# Patient Record
Sex: Female | Born: 1980 | Race: Black or African American | Hispanic: No | Marital: Single | Smoking: Current every day smoker
Health system: Southern US, Community
[De-identification: ages and names within clinical notes are randomized; demographics above are authoritative.]

## PROBLEM LIST (undated history)

## (undated) DIAGNOSIS — E079 Disorder of thyroid, unspecified: Secondary | ICD-10-CM

## (undated) DIAGNOSIS — E119 Type 2 diabetes mellitus without complications: Secondary | ICD-10-CM

## (undated) DIAGNOSIS — K219 Gastro-esophageal reflux disease without esophagitis: Secondary | ICD-10-CM

## (undated) DIAGNOSIS — K589 Irritable bowel syndrome without diarrhea: Secondary | ICD-10-CM

## (undated) DIAGNOSIS — M797 Fibromyalgia: Secondary | ICD-10-CM

---

## 2020-11-07 ENCOUNTER — Ambulatory Visit: Admit: 2020-11-07 | Payer: Self-pay

## 2020-11-07 ENCOUNTER — Ambulatory Visit
Admission: EM | Admit: 2020-11-07 | Discharge: 2020-11-07 | Disposition: A | Discharge status: Home / Self Care | Payer: Self-pay | Attending: Emergency Medicine | Admitting: Emergency Medicine

## 2020-11-07 ENCOUNTER — Telehealth: Payer: Self-pay

## 2020-11-07 ENCOUNTER — Other Ambulatory Visit: Payer: Self-pay

## 2020-11-07 DIAGNOSIS — N760 Acute vaginitis: Secondary | ICD-10-CM | POA: Insufficient documentation

## 2020-11-07 DIAGNOSIS — B9689 Other specified bacterial agents as the cause of diseases classified elsewhere: Secondary | ICD-10-CM | POA: Insufficient documentation

## 2020-11-07 HISTORY — DX: Fibromyalgia: M79.7

## 2020-11-07 HISTORY — DX: Type 2 diabetes mellitus without complications: E11.9

## 2020-11-07 HISTORY — DX: Disorder of thyroid, unspecified: E07.9

## 2020-11-07 HISTORY — DX: Gastro-esophageal reflux disease without esophagitis: K21.9

## 2020-11-07 HISTORY — DX: Irritable bowel syndrome, unspecified: K58.9

## 2020-11-07 LAB — CHLAMYDIA/NGC RT PCR (ARMC ONLY)
Chlamydia Tr: NOT DETECTED
N gonorrhoeae: NOT DETECTED

## 2020-11-07 LAB — WET PREP, GENITAL
Sperm: NONE SEEN
Trich, Wet Prep: NONE SEEN
Yeast Wet Prep HPF POC: NONE SEEN

## 2020-11-07 MED ORDER — FLUCONAZOLE 150 MG PO TABS: 150.0000 mg | ORAL_TABLET | ORAL | 0 refills | Status: AC | PRN

## 2020-11-07 MED ORDER — METRONIDAZOLE 500 MG PO TABS: 500.0000 mg | ORAL_TABLET | Freq: Two times a day (BID) | ORAL | 0 refills | Status: DC

## 2020-11-07 MED ORDER — FLUCONAZOLE 150 MG PO TABS: 150.0000 mg | ORAL_TABLET | ORAL | 0 refills | Status: DC | PRN

## 2020-11-07 MED ORDER — NYSTATIN 100000 UNIT/GM EX CREA: TOPICAL_CREAM | CUTANEOUS | 0 refills | Status: AC

## 2020-11-07 MED ORDER — NYSTATIN 100000 UNIT/GM EX CREA: TOPICAL_CREAM | CUTANEOUS | 0 refills | Status: DC

## 2020-11-07 MED ORDER — METRONIDAZOLE 500 MG PO TABS: 500.0000 mg | ORAL_TABLET | Freq: Two times a day (BID) | ORAL | 0 refills | Status: AC

## 2020-11-07 NOTE — ED Provider Notes (Signed)
[facility]    CSN: [id number] Arrival date & time: 11/07/20  0954      History   Chief Complaint Chief Complaint  Patient presents with  . Vaginal Itching    HPI Angela Landry is a 40 y.o. female presenting for vaginal itching and burning for the past couple of days.  She denies any discharge.  Patient afraid she may have a vaginal yeast infection.  She did use over-the-counter yeast infection medication so symptoms improved a little bit but she still having intense itching.  Patient also mitts to history of bacterial vaginosis.  She denies any concern for STIs but does request STI testing today.  Patient denying any pelvic or abdominal pain, dysuria, urinary frequency urgency or back pain.  Patient does have history of diabetes and says her blood sugars have not been under very good control lately.  She takes Ozempic but no other medications.  No other concerns.  HPI  Past Medical History:  Diagnosis Date  . Diabetes mellitus without complication (HCC)   . Fibromyalgia   . GERD (gastroesophageal reflux disease)   . IBS (irritable bowel syndrome)   . Thyroid disease     There are no problems to display for this patient.   History reviewed. No pertinent surgical history.  OB History   No obstetric history on file.      Home Medications    Prior to Admission medications   Medication Sig Start Date End Date Taking? Authorizing Provider  ACCU-CHEK GUIDE test strip USE 1-4 TIMES DAILY AS DIRECTED BY PHYSICIAN TO MONITOR BLOOD SUGARS 10/10/20  Yes [provider]  ARMOUR THYROID 120 MG tablet Take 120 mg by mouth every morning. 10/10/20  Yes [provider]  ASPIRIN LOW DOSE 81 MG EC tablet Take 81 mg by mouth daily. 10/10/20  Yes [provider]  cyclobenzaprine (FLEXERIL) 10 MG tablet Take 1 tablet by mouth at bedtime as needed. 10/11/20  Yes [provider]  DULoxetine (CYMBALTA) 60 MG capsule Take 60 mg by mouth daily. 10/10/20   Yes [provider]  fluconazole (DIFLUCAN) 150 MG tablet Take 1 tablet (150 mg total) by mouth every 3 (three) days as needed for up to 7 days (vaginal infection). 11/07/20 11/14/20 Yes Eusebio Friendly B, PA-C  gabapentin (NEURONTIN) 300 MG capsule Take 3 capsules by mouth daily. 08/08/20  Yes [provider]  levonorgestrel (MIRENA) 20 MCG/DAY IUD by Intrauterine route.   Yes [provider]  linaclotide Karlene Einstein) 290 MCG CAPS capsule  12/08/15  Yes [provider]  meloxicam (MOBIC) 15 MG tablet Take 1 tablet by mouth daily as needed. 09/14/20  Yes [provider]  metroNIDAZOLE (FLAGYL) 500 MG tablet Take 1 tablet (500 mg total) by mouth 2 (two) times daily for 7 days. 11/07/20 11/14/20 Yes Eusebio Friendly B, PA-C  nystatin cream (MYCOSTATIN) Apply to affected area 2 times daily 11/07/20 11/14/20 Yes Eusebio Friendly B, PA-C  OZEMPIC, 0.25 OR 0.5 MG/DOSE, 2 MG/1.5ML SOPN Inject 0.5 mg into the skin once a week. 10/10/20  Yes [provider]  pantoprazole (PROTONIX) 40 MG tablet Take 1 tablet by mouth daily. 10/10/20  Yes [provider]  promethazine (PHENERGAN) 25 MG tablet Take 25 mg by mouth every 6 (six) hours as needed. 09/30/20  Yes [provider]    Family History No family history on file.  Social History Social History   Tobacco Use  . Smoking status: Never Smoker  . Smokeless tobacco:  Never Used  Substance Use Topics  . Alcohol use: Yes  . Drug use: Never     Allergies   Patient has no known allergies.   Review of Systems Review of Systems  Constitutional: Negative for fatigue and fever.  Gastrointestinal: Negative for abdominal pain, nausea and vomiting.  Genitourinary: Positive for vaginal discharge. Negative for dysuria, flank pain, frequency, hematuria, urgency and vaginal pain.       Vaginal itching  Musculoskeletal: Negative for back pain.  Skin: Negative for rash.     Physical Exam Triage Vital Signs ED  Triage Vitals  Enc Vitals Group     BP 11/07/20 1026 (!) 169/101     Pulse Rate 11/07/20 1026 (!) 101     Resp 11/07/20 1026 18     Temp 11/07/20 1026 98.4 F (36.9 C)     Temp Source 11/07/20 1026 Oral     SpO2 11/07/20 1026 99 %     Weight 11/07/20 1025 238 lb (108 kg)     Height --      Head Circumference --      Peak Flow --      Pain Score 11/07/20 1025 7     Pain Loc --      Pain Edu? --      Excl. in GC? --    No data found.  Updated Vital Signs BP (!) 169/101 (BP Location: Left Arm)   Pulse (!) 101   Temp 98.4 F (36.9 C) (Oral)   Resp 18   Wt 238 lb (108 kg)   LMP 11/01/2020   SpO2 99%       Physical Exam Vitals and nursing note reviewed.  Constitutional:      General: She is not in acute distress.    Appearance: Normal appearance. She is not ill-appearing or toxic-appearing.  HENT:     Head: Normocephalic and atraumatic.  Eyes:     General: No scleral icterus.       Right eye: No discharge.        Left eye: No discharge.     Conjunctiva/sclera: Conjunctivae normal.  Cardiovascular:     Rate and Rhythm: Normal rate and regular rhythm.     Heart sounds: Normal heart sounds.  Pulmonary:     Effort: Pulmonary effort is normal. No respiratory distress.     Breath sounds: Normal breath sounds.  Abdominal:     Palpations: Abdomen is soft.     Tenderness: There is no abdominal tenderness.  Musculoskeletal:     Cervical back: Neck supple.  Skin:    General: Skin is dry.  Neurological:     General: No focal deficit present.     Mental Status: She is alert. Mental status is at baseline.     Motor: No weakness.     Gait: Gait normal.  Psychiatric:        Mood and Affect: Mood normal.        Behavior: Behavior normal.        Thought Content: Thought content normal.      UC Treatments / Results  Labs (all labs ordered are listed, but only abnormal results are displayed) Labs Reviewed  WET PREP, GENITAL - Abnormal; Notable for the following  components:      Result Value   Clue Cells Wet Prep HPF POC PRESENT (*)    WBC, Wet Prep HPF POC FEW (*)    All other components within normal limits  CHLAMYDIA/NGC RT PCR (  [facility] ONLY)    EKG   Radiology No results found.  Procedures Procedures (including critical care time)  Medications Ordered in UC Medications - No data to display  Initial Impression / Assessment and Plan / UC Course  I have reviewed the triage vital signs and the nursing notes.  Pertinent labs & imaging results that were available during my care of the patient were reviewed by me and considered in my medical decision making (see chart for details).   40 year old female presenting for vaginal itching.  Patient elects to self swab today.  Wet prep is positive for clue cells.  Treating BV infection with metronidazole.  Since she also has intense vaginal itching did have a little bit of improvement with Monistat, sending in some Diflucan and clotrimazole for her in case she does have a vaginal yeast infection along with the BV infection.  Advised her to keep a close eye on her blood sugars and follow-up with PCP if they are not starting to get under control once her infection resolves.  STI testing performed.  Advised patient on how to access results.  Will await results before treating.   Final Clinical Impressions(s) / UC Diagnoses   Final diagnoses:  Bacterial vaginosis  Vaginitis and vulvovaginitis     Discharge Instructions     You have bacterial vaginosis.  I have sent in the metronidazole for this.  Do not drink alcohol when you take it.  Also sent in medication for yeast infection.  Your STI test will be back within 24 hours.  Results will be on MyChart but we will call if symptoms positive.  Given 9 your blood sugars and follow-up with your PCP if they are not appearing to be controlled with the Ozempic.    ED Prescriptions    Medication Sig Dispense Auth. Provider   metroNIDAZOLE  (FLAGYL) 500 MG tablet Take 1 tablet (500 mg total) by mouth 2 (two) times daily for 7 days. 14 tablet Eusebio Friendly B, PA-C   nystatin cream (MYCOSTATIN) Apply to affected area 2 times daily 30 g Michiel Cowboy, Srishti Strnad B, PA-C   fluconazole (DIFLUCAN) 150 MG tablet Take 1 tablet (150 mg total) by mouth every 3 (three) days as needed for up to 7 days (vaginal infection). 2 tablet Gareth Morgan     PDMP not reviewed this encounter.   Shirlee Latch, PA-C 11/07/20 1100

## 2020-11-07 NOTE — Discharge Instructions (Signed)
You have bacterial vaginosis.  I have sent in the metronidazole for this.  Do not drink alcohol when you take it.  Also sent in medication for yeast infection.  Your STI test will be back within 24 hours.  Results will be on MyChart but we will call if symptoms positive.  Given 9 your blood sugars and follow-up with your PCP if they are not appearing to be controlled with the Ozempic.

## 2020-11-07 NOTE — ED Triage Notes (Signed)
Pt c/o vaginal itching and burning. States she gets a yeast infection or bv often. Started Saturday

## 2021-10-15 ENCOUNTER — Ambulatory Visit
Admission: EM | Admit: 2021-10-15 | Discharge: 2021-10-15 | Disposition: A | Discharge status: Home / Self Care | Payer: BLUE CROSS/BLUE SHIELD | Attending: Emergency Medicine | Admitting: Emergency Medicine

## 2021-10-15 ENCOUNTER — Ambulatory Visit (INDEPENDENT_AMBULATORY_CARE_PROVIDER_SITE_OTHER): Payer: BLUE CROSS/BLUE SHIELD

## 2021-10-15 ENCOUNTER — Other Ambulatory Visit: Payer: Self-pay

## 2021-10-15 DIAGNOSIS — M25521 Pain in right elbow: Secondary | ICD-10-CM

## 2021-10-15 MED ORDER — METHYLPREDNISOLONE SODIUM SUCC 40 MG IJ SOLR
60.0000 mg | Freq: Once | INTRAMUSCULAR | Status: AC
  Administered 2021-10-15: 60 mg via INTRAMUSCULAR

## 2021-10-15 MED ORDER — NAPROXEN SODIUM 550 MG PO TABS: 550.0000 mg | ORAL_TABLET | Freq: Two times a day (BID) | ORAL | 0 refills | Status: DC

## 2021-10-15 MED ORDER — CYCLOBENZAPRINE HCL 10 MG PO TABS: 10.0000 mg | ORAL_TABLET | Freq: Every day | ORAL | 0 refills | Status: DC

## 2021-10-15 NOTE — ED Triage Notes (Signed)
Patient c.o Right elbow pain - "cant bend it, it hurts." This has been going on for 3 months. Patient denies any injury to that arm.  "I have tried everything, I even has it in a sling but it hurts hurts." ?

## 2021-10-15 NOTE — ED Provider Notes (Signed)
?[facility] ? ? ? ?CSN: [id number] ?Arrival date & time: 10/15/21  2924 ? ? ?  ? ?History   ?Chief Complaint ?No chief complaint on file. ? ? ?HPI ?Angela Landry is a 41 y.o. female.  ? ?Presents with constant right elbow pain radiating to the right shoulder for 3 months.  Pain is rated a 10 out of 10.  Associated numbness and tingling extending from elbow into the right hand.  Range of motion intact but elicits pain.  Has attempted use of over-the-counter ibuprofen which has been ineffective.  Denies precipitating event, injury or trauma prior to symptoms beginning. ?Past Medical History:  ?Diagnosis Date  ? Diabetes mellitus without complication (HCC)   ? Fibromyalgia   ? GERD (gastroesophageal reflux disease)   ? IBS (irritable bowel syndrome)   ? Thyroid disease   ? ? ?There are no problems to display for this patient. ? ? ?No past surgical history on file. ? ?OB History   ?No obstetric history on file. ?  ? ? ? ?Home Medications   ? ?Prior to Admission medications   ?Medication Sig Start Date End Date Taking? Authorizing Provider  ?ACCU-CHEK GUIDE test strip USE 1-4 TIMES DAILY AS DIRECTED BY PHYSICIAN TO MONITOR BLOOD SUGARS 10/10/20   [provider]  ?ARMOUR THYROID 120 MG tablet Take 120 mg by mouth every morning. 10/10/20   [provider]  ?ASPIRIN LOW DOSE 81 MG EC tablet Take 81 mg by mouth daily. 10/10/20   [provider]  ?cyclobenzaprine (FLEXERIL) 10 MG tablet Take 1 tablet by mouth at bedtime as needed. 10/11/20   [provider]  ?DULoxetine (CYMBALTA) 60 MG capsule Take 60 mg by mouth daily. 10/10/20   [provider]  ?gabapentin (NEURONTIN) 300 MG capsule Take 3 capsules by mouth daily. 08/08/20   [provider]  ?levonorgestrel (MIRENA) 20 MCG/DAY IUD by Intrauterine route.    [provider]  ?linaclotide Karlene Einstein) 290 MCG CAPS capsule  12/08/15   [provider]  ?meloxicam (MOBIC) 15 MG tablet Take 1 tablet by mouth  daily as needed. 09/14/20   [provider]  ?OZEMPIC, 0.25 OR 0.5 MG/DOSE, 2 MG/1.5ML SOPN Inject 0.5 mg into the skin once a week. 10/10/20   [provider]  ?pantoprazole (PROTONIX) 40 MG tablet Take 1 tablet by mouth daily. 10/10/20   [provider]  ?promethazine (PHENERGAN) 25 MG tablet Take 25 mg by mouth every 6 (six) hours as needed. 09/30/20   [provider]  ? ? ?Family History ?No family history on file. ? ?Social History ?Social History  ? ?Tobacco Use  ? Smoking status: Never  ? Smokeless tobacco: Never  ?Substance Use Topics  ? Alcohol use: Yes  ? Drug use: Never  ? ? ? ?Allergies   ?Patient has no known allergies. ? ? ?Review of Systems ?Review of Systems ? ? ?Physical Exam ?Triage Vital Signs ?ED Triage Vitals  ?Enc Vitals Group  ?   BP 10/15/21 0930 124/88  ?   Pulse Rate 10/15/21 0930 88  ?   Resp 10/15/21 0930 20  ?   Temp 10/15/21 0930 98.8 ?F (37.1 ?C)  ?   Temp Source 10/15/21 0930 Oral  ?   SpO2 10/15/21 0930 100 %  ?   Weight 10/15/21 0928 215 lb (97.5 kg)  ?   Height 10/15/21 0928 5\' 4"  (1.626 m)  ?   Head Circumference --   ?   Peak Flow --   ?  Pain Score 10/15/21 0927 10  ?   Pain Loc --   ?   Pain Edu? --   ?   Excl. in GC? --   ? ?No data found. ? ?Updated Vital Signs ?BP 124/88 (BP Location: Left Arm)   Pulse 88   Temp 98.8 ?F (37.1 ?C) (Oral)   Resp 20   Ht 5\' 4"  (1.626 m)   Wt 215 lb (97.5 kg)   SpO2 100%   BMI 36.90 kg/m?  ? ?Visual Acuity ?Right Eye Distance:   ?Left Eye Distance:   ?Bilateral Distance:   ? ?Right Eye Near:   ?Left Eye Near:    ?Bilateral Near:    ? ?Physical Exam ? ? ?UC Treatments / Results  ?Labs ?(all labs ordered are listed, but only abnormal results are displayed) ?Labs Reviewed - No data to display ? ?EKG ? ? ?Radiology ?No results found. ? ?Procedures ?Procedures (including critical care time) ? ?Medications Ordered in UC ?Medications - No data to display ? ?Initial Impression / Assessment and Plan / UC Course  ?I  have reviewed the triage vital signs and the nursing notes. ? ?Pertinent labs & imaging results that were available during my care of the patient were reviewed by me and considered in my medical decision making (see chart for details). ? ?Right elbow pain ? ?X-ray negative, discussed findings with patient, methylprednisolone injection given in office, discussed effects on blood sugar, advised to monitor closely, naproxen and Flexeril prescribed for outpatient management, recommended continued RICE, heat, arm sling and activity as tolerated, given walking referral to orthopedics if symptoms continue to persist ?Final Clinical Impressions(s) / UC Diagnoses  ? ?Final diagnoses:  ?None  ? ?Discharge Instructions   ?None ?  ? ?ED Prescriptions   ?None ?  ? ?PDMP not reviewed this encounter. ?  ? , NP ?10/15/21 1028 ? ?

## 2021-10-15 NOTE — Discharge Instructions (Addendum)
Your x-ray today did not show injury to the bone of your elbow. Your pain is most likely being caused by irritation to the soft tissues, this should improve as time progresses.  ? ?During tomorrow take naproxen twice daily for 5 days then as needed ? ?You may use muscle relaxer at bedtime for additional comfort, be mindful this medicine may make you drowsy ? ?You may apply heat or ice, whichever makes you feel better, to affected area in 15 minute intervals ? ?You may continue activity as tolerated, there is no injury therefore, it is important that you continue to move around so you do not loose strength to the area ? ? you may wrap arm  with ace wrap or continue use of sling. for additional support while completing activities, once wrapped if you begin to experience numbness or tingling it is too tight, remove and redo, you should be able to easily fit one finger under wrap  ? ?If symptoms persist ,you may follow up at urgent care or with orthopedic specialist for evaluation, an orthopedic doctor specializes in the bone, they may provide  management such as but not limited to imaging, long term medications and physical therapy  ?

## 2022-08-25 ENCOUNTER — Emergency Department: Payer: BLUE CROSS/BLUE SHIELD

## 2022-08-25 ENCOUNTER — Emergency Department
Admission: EM | Admit: 2022-08-25 | Discharge: 2022-08-25 | Disposition: A | Discharge status: Home / Self Care | Payer: BLUE CROSS/BLUE SHIELD | Attending: Emergency Medicine | Admitting: Emergency Medicine

## 2022-08-25 ENCOUNTER — Emergency Department (HOSPITAL_COMMUNITY): Payer: BLUE CROSS/BLUE SHIELD

## 2022-08-25 ENCOUNTER — Other Ambulatory Visit: Payer: Self-pay

## 2022-08-25 ENCOUNTER — Emergency Department
Admission: EM | Admit: 2022-08-25 | Discharge: 2022-08-26 | Disposition: A | Discharge status: Home / Self Care | Payer: BLUE CROSS/BLUE SHIELD | Source: Home / Self Care | Attending: Student in an Organized Health Care Education/Training Program | Admitting: Student in an Organized Health Care Education/Training Program

## 2022-08-25 DIAGNOSIS — F10129 Alcohol abuse with intoxication, unspecified: Secondary | ICD-10-CM | POA: Insufficient documentation

## 2022-08-25 DIAGNOSIS — M79604 Pain in right leg: Secondary | ICD-10-CM | POA: Insufficient documentation

## 2022-08-25 DIAGNOSIS — F10220 Alcohol dependence with intoxication, uncomplicated: Secondary | ICD-10-CM | POA: Diagnosis not present

## 2022-08-25 DIAGNOSIS — Y908 Blood alcohol level of 240 mg/100 ml or more: Secondary | ICD-10-CM | POA: Insufficient documentation

## 2022-08-25 DIAGNOSIS — F10929 Alcohol use, unspecified with intoxication, unspecified: Secondary | ICD-10-CM

## 2022-08-25 DIAGNOSIS — Y909 Presence of alcohol in blood, level not specified: Secondary | ICD-10-CM | POA: Insufficient documentation

## 2022-08-25 DIAGNOSIS — E119 Type 2 diabetes mellitus without complications: Secondary | ICD-10-CM | POA: Diagnosis not present

## 2022-08-25 DIAGNOSIS — M7121 Synovial cyst of popliteal space [Baker], right knee: Secondary | ICD-10-CM

## 2022-08-25 DIAGNOSIS — R252 Cramp and spasm: Secondary | ICD-10-CM

## 2022-08-25 DIAGNOSIS — F10920 Alcohol use, unspecified with intoxication, uncomplicated: Secondary | ICD-10-CM

## 2022-08-25 LAB — CBC WITH DIFFERENTIAL/PLATELET
Abs Immature Granulocytes: 0.02 10*3/uL (ref 0.00–0.07)
Basophils Absolute: 0.1 10*3/uL (ref 0.0–0.1)
Basophils Relative: 1 %
Eosinophils Absolute: 0 10*3/uL (ref 0.0–0.5)
Eosinophils Relative: 0 %
HCT: 40.1 % (ref 36.0–46.0)
Hemoglobin: 13.8 g/dL (ref 12.0–15.0)
Immature Granulocytes: 0 %
Lymphocytes Relative: 29 %
Lymphs Abs: 2 10*3/uL (ref 0.7–4.0)
MCH: 33.3 pg (ref 26.0–34.0)
MCHC: 34.4 g/dL (ref 30.0–36.0)
MCV: 96.9 fL (ref 80.0–100.0)
Monocytes Absolute: 0.3 10*3/uL (ref 0.1–1.0)
Monocytes Relative: 5 %
Neutro Abs: 4.7 10*3/uL (ref 1.7–7.7)
Neutrophils Relative %: 65 %
Platelets: 322 10*3/uL (ref 150–400)
RBC: 4.14 MIL/uL (ref 3.87–5.11)
RDW: 15.6 % — ABNORMAL HIGH (ref 11.5–15.5)
WBC: 7.2 10*3/uL (ref 4.0–10.5)
nRBC: 0 % (ref 0.0–0.2)

## 2022-08-25 LAB — COMPREHENSIVE METABOLIC PANEL
ALT: 25 U/L (ref 0–44)
AST: 40 U/L (ref 15–41)
Albumin: 4.1 g/dL (ref 3.5–5.0)
Alkaline Phosphatase: 95 U/L (ref 38–126)
Anion gap: 11 (ref 5–15)
BUN: 5 mg/dL — ABNORMAL LOW (ref 6–20)
CO2: 24 mmol/L (ref 22–32)
Calcium: 8.2 mg/dL — ABNORMAL LOW (ref 8.9–10.3)
Chloride: 101 mmol/L (ref 98–111)
Creatinine, Ser: 0.64 mg/dL (ref 0.44–1.00)
GFR, Estimated: >60 mL/min (ref >60)
Glucose, Bld: 247 mg/dL — ABNORMAL HIGH (ref 70–99)
Potassium: 3 mmol/L — ABNORMAL LOW (ref 3.5–5.1)
Sodium: 136 mmol/L (ref 135–145)
Total Bilirubin: 1.3 mg/dL — ABNORMAL HIGH (ref 0.3–1.2)
Total Protein: 7.8 g/dL (ref 6.5–8.1)

## 2022-08-25 LAB — LIPASE, BLOOD: Lipase: 35 U/L (ref 11–51)

## 2022-08-25 LAB — ETHANOL: Alcohol, Ethyl (B): 409 mg/dL (ref <10)

## 2022-08-25 LAB — TROPONIN I (HIGH SENSITIVITY): Troponin I (High Sensitivity): 8 ng/L (ref <18)

## 2022-08-25 LAB — MAGNESIUM: Magnesium: 2 mg/dL (ref 1.7–2.4)

## 2022-08-25 MED ORDER — HYDROXYZINE HCL 25 MG PO TABS
25.0000 mg | ORAL_TABLET | Freq: Once | ORAL | Status: AC
  Administered 2022-08-25: 25 mg via ORAL
  Filled 2022-08-25: qty 1

## 2022-08-25 MED ORDER — IOHEXOL 350 MG/ML SOLN
75.0000 mL | Freq: Once | INTRAVENOUS | Status: AC | PRN
  Administered 2022-08-25: 75 mL via INTRAVENOUS

## 2022-08-25 MED ORDER — POTASSIUM CHLORIDE CRYS ER 20 MEQ PO TBCR
40.0000 meq | EXTENDED_RELEASE_TABLET | Freq: Once | ORAL | Status: AC
  Administered 2022-08-25: 40 meq via ORAL
  Filled 2022-08-25: qty 2

## 2022-08-25 NOTE — ED Notes (Signed)
Patient transported to Ultrasound 

## 2022-08-25 NOTE — ED Notes (Addendum)
Per Dr. Quentin Cornwall, pt lacks competency to make decision to leave at this time. Security made aware. Per Dr. Quentin Cornwall, pt is not safe to leave department at this time due to level of intoxication.

## 2022-08-25 NOTE — ED Notes (Signed)
See triage note. Pt was here earlier today due to leg pain and intoxication and was discharged. Patient left and drank more and came here for alcohol intoxication. When patient brought to bed tried getting up and walking down the hall to "get out of here" while this RN and NT walking with patient. Stated I was going to call security due to patient intoxication levels and unable to make own decisions. Pt came back to hallway bed and is resting with warm blankets. Security is at patient bedside at this time.

## 2022-08-25 NOTE — ED Triage Notes (Signed)
Pt was seen today at this ER for leg pain and ETOH. Pt was dc'd and drank more alcohol. Pt came in saying "I want help" pt appears obviously intoxicated and needs assistance to ambulate.

## 2022-08-25 NOTE — ED Provider Notes (Signed)
Procedures     ----------------------------------------- 4:01 PM on 08/25/2022 -----------------------------------------  Ambulatory with steady gait, tolerating oral intake, clear speech.  Clinically sober.  CTA chest and ultrasound right lower extremity unremarkable.  Labs unremarkable.  Patient request discharge and is stable to do so.      Carrie Mew, MD 08/25/22 [phone]

## 2022-08-25 NOTE — ED Notes (Signed)
ED Provider at bedside. 

## 2022-08-25 NOTE — ED Notes (Addendum)
Pt again attempting to get up and walk around independently NT informed patient she needed to get back in bed and patient got back into bed that we cannot have her walking around the unit and she cannot leave at this time.

## 2022-08-25 NOTE — ED Notes (Signed)
Pt asking about anxiety meds, ativan. Told pt will discuss with Dr. Jacelyn Grip

## 2022-08-25 NOTE — ED Notes (Signed)
Patient transported to CT 

## 2022-08-25 NOTE — Discharge Instructions (Addendum)
Your CT scan and ultrasound today were both okay.  The ultrasound shows a small fluid cyst on the back of your right knee, which is benign but may cause some discomfort.  You can follow up with your doctor to have it drained if needed.

## 2022-08-25 NOTE — ED Provider Notes (Signed)
[facility] Provider Note    Event Date/Time   First MD Initiated Contact with Patient 08/25/22 1908     (approximate)   History   Alcohol Intoxication   HPI  Angela Landry is a 42 y.o. female with a history of alcohol abuse presents to the ER after being discharged earlier this morning with evaluation of concern for DVT her workup this morning was unrevealing.  She was discharged once sober went home continued to drink additional alcohol and called EMS.  There is no report of any additional falls.  She has no new complaints the only thing that she says is "need to [facility]. "Patient smells heavily of alcohol does appear clinically intoxicated.      Physical Exam   Triage Vital Signs: ED Triage Vitals [08/25/22 1914]  Enc Vitals Group     BP (!) 156/100     Pulse Rate (!) 104     Resp 20     Temp 99.4 F (37.4 C)     Temp src      SpO2 100 %     Weight      Height      Head Circumference      Peak Flow      Pain Score 3     Pain Loc      Pain Edu?      Excl. in [place]?     Most recent vital signs: Vitals:   08/25/22 1914  BP: (!) 156/100  Pulse: (!) 104  Resp: 20  Temp: 99.4 F (37.4 C)  SpO2: 100%     Constitutional: Smells heavily of alcohol, protecting her airway Eyes: Conjunctivae are normal.  Head: Atraumatic. Nose: No congestion/rhinnorhea. Mouth/Throat: Mucous membranes are moist.   Neck: Painless ROM.  Cardiovascular:   Good peripheral circulation. Respiratory: Normal respiratory effort.  No retractions.  Gastrointestinal: Soft and nontender.  Musculoskeletal:  no deformity Neurologic:  MAE spontaneously. No gross focal neurologic deficits are appreciated.  Skin:  Skin is warm, dry and intact. No rash noted. Psychiatric: Calm and cooperative    ED Results / Procedures / Treatments   Labs (all labs ordered are listed, but only abnormal results are displayed) Labs Reviewed - No data to  display   EKG     RADIOLOGY    PROCEDURES:  Critical Care performed:   Procedures   MEDICATIONS ORDERED IN ED: Medications - No data to display   IMPRESSION / MDM / [place] / ED COURSE  I reviewed the triage vital signs and the nursing notes.                              Differential diagnosis includes, but is not limited to, substance abuse, EtOH ingestion, alcohol dependence  Patient Angela Landry presented to the ER intoxicated after extensive evaluation earlier this morning.  No report of any trauma she does not have any new medical complaints or issues.  Will observe.   Clinical Course as of 08/25/22 2249  Sat Aug 25, 2022  2043 Patient continuously getting up trying to leave.  She does not complain of any new pain and is stating that she does not want to wait here.  She does appear too intoxicated to be safely released right now.  Do not think that she has the capacity to be discharged At this time.  Will be moved to other side of the  ER where she can be monitored for sobriety. [PR]  2248 Patient reassessed.  Do suspect she is a fast metabolizer as she is now ambulating with a steady gait she is alert and oriented x 3.  No unsteady gait.  Do not feel that she is a fall risk at this time.  Repeat exam is reassuring and nonfocal.  She is requesting discharge home.  At this point I think that she is clinically sober to be discharged with safe ride home. [PR]    Clinical Course User Index [PR] Merlyn Lot, MD     FINAL CLINICAL IMPRESSION(S) / ED DIAGNOSES   Final diagnoses:  Alcoholic intoxication with complication [facility])     Rx / DC Orders   ED Discharge Orders     None        Note:  This document was prepared using Dragon voice recognition software and may include unintentional dictation errors.    Merlyn Lot, MD 08/25/22 2113

## 2022-08-25 NOTE — ED Provider Notes (Signed)
[facility] Provider Note    Event Date/Time   First MD Initiated Contact with Patient 08/25/22 1207     (approximate)   History   Leg Pain and Alcohol Intoxication   HPI  Angela Landry is a 42 y.o. female   Past medical history of diabetes, fibromyalgia GERD, IBS, thyroid disease who presents to the emergency department with alcohol intoxication and complaints of right lower extremity pain and swelling.  Over the past few days she has noticed her right calf swelling and pain that has now progressed to the thigh.  She drank alcohol today.  She denies trauma.  Also states that she has chest pain intermittently.  She denies shortness of breath, cough, fever.    External Medical Documents Reviewed: Telemedicine visit by psychiatry dated 08/17/2022 for alcohol use      Physical Exam   Triage Vital Signs: ED Triage Vitals  Enc Vitals Group     BP 08/25/22 1225 (!) 162/99     Pulse Rate 08/25/22 1225 (!) 120     Resp 08/25/22 1225 18     Temp 08/25/22 1225 98.8 F (37.1 C)     Temp Source 08/25/22 1225 Oral     SpO2 08/25/22 1225 96 %     Weight 08/25/22 1226 200 lb (90.7 kg)     Height 08/25/22 1226 5\' 4"  (1.626 m)     Head Circumference --      Peak Flow --      Pain Score 08/25/22 1225 4     Pain Loc --      Pain Edu? --      Excl. in [place]? --     Most recent vital signs: Vitals:   08/25/22 1225 08/25/22 1525  BP: (!) 162/99   Pulse: (!) 120 (!) 106  Resp: 18   Temp: 98.8 F (37.1 C)   SpO2: 96%     General: Awake, no distress.  CV:  Good peripheral perfusion.  Resp:  Normal effort.  Abd:  No distention.  Other:  Awake alert slurred speech but no facial asymmetry or motor or sensory deficits to extremities, ambulating with steady gait in the emergency department.  She has some tenderness to palpation of the right calf.  Warm and well-perfused.  Tachycardic 120s, down to 100s by the time I assessed her.  Hypertensive 160/90.   Afebrile.  Abdomen soft nontender lungs clear.   ED Results / Procedures / Treatments   Labs (all labs ordered are listed, but only abnormal results are displayed) Labs Reviewed  COMPREHENSIVE METABOLIC PANEL - Abnormal; Notable for the following components:      Result Value   Potassium 3.0 (*)    Glucose, Bld 247 (*)    BUN 5 (*)    Calcium 8.2 (*)    Total Bilirubin 1.3 (*)    All other components within normal limits  ETHANOL - Abnormal; Notable for the following components:   Alcohol, Ethyl (B) 409 (*)    All other components within normal limits  CBC WITH DIFFERENTIAL/PLATELET - Abnormal; Notable for the following components:   RDW 15.6 (*)    All other components within normal limits  LIPASE, BLOOD  MAGNESIUM  TROPONIN I (HIGH SENSITIVITY)  TROPONIN I (HIGH SENSITIVITY)     I ordered and reviewed the above labs they are notable for her alcohol level is in the 400s.  EKG  ED ECG REPORT I, Lucillie Garfinkel, the attending physician, personally viewed  and interpreted this ECG.   Date: 08/25/2022  EKG Time: 1316  Rate: 101  Rhythm: sinus tachycardia  Axis: nl  Intervals:none  ST&T Change: no acute ischemic changes    RADIOLOGY I independently reviewed and interpreted CT angio of the chest and see no obvious large filling defects   PROCEDURES:  Critical Care performed: No  Procedures   MEDICATIONS ORDERED IN ED: Medications  hydrOXYzine (ATARAX) tablet 25 mg (has no administration in time range)  iohexol (OMNIPAQUE) 350 MG/ML injection 75 mL (75 mLs Intravenous Contrast Given 08/25/22 1431)  potassium chloride SA (KLOR-CON M) CR tablet 40 mEq (40 mEq Oral Given 08/25/22 1450)     IMPRESSION / MDM / ASSESSMENT AND PLAN / ED COURSE  I reviewed the triage vital signs and the nursing notes.                                Patient's presentation is most consistent with acute presentation with potential threat to life or bodily function.  Differential  diagnosis includes, but is not limited to, DVT, PE, ACS, alcohol intoxication, electrolyte disturbance or muscle cramps   The patient is on the cardiac monitor to evaluate for evidence of arrhythmia and/or significant heart rate changes.  MDM: This patient with alcohol intoxication ethanol level in the 400s complaints of right lower extremity cramping pain and intermittent chest pain.  Will get basic labs, EKG, troponin and a CT angiogram as well as a DVT ultrasound as I am considering cardiac ischemia, VTE in the differential diagnosis.  Patient in stable condition, signed out pending imaging reads.       FINAL CLINICAL IMPRESSION(S) / ED DIAGNOSES   Final diagnoses:  Alcoholic intoxication without complication ([place])  Leg cramping     Rx / DC Orders   ED Discharge Orders     None        Note:  This document was prepared using Dragon voice recognition software and may include unintentional dictation errors.    Lucillie Garfinkel, MD 08/25/22 [phone]

## 2022-08-25 NOTE — ED Triage Notes (Signed)
Pt to ED via [facility] from Home. Pt complaining of Right leg pain and ETOH. Pt states right leg pain and swelling for past two days. Pt reports drinking ETOH about two hours ago, pt denies SOB/CP.  Pt with hx of diabetes and thyroid disease.  Pt vitals from EMS: CBG 242 100% RA 164/109 130HR

## 2022-09-01 ENCOUNTER — Other Ambulatory Visit: Payer: Self-pay

## 2022-09-01 DIAGNOSIS — M79604 Pain in right leg: Secondary | ICD-10-CM | POA: Diagnosis present

## 2022-09-01 DIAGNOSIS — E119 Type 2 diabetes mellitus without complications: Secondary | ICD-10-CM | POA: Insufficient documentation

## 2022-09-01 DIAGNOSIS — R451 Restlessness and agitation: Secondary | ICD-10-CM | POA: Insufficient documentation

## 2022-09-01 DIAGNOSIS — Z794 Long term (current) use of insulin: Secondary | ICD-10-CM | POA: Diagnosis not present

## 2022-09-01 DIAGNOSIS — Z7982 Long term (current) use of aspirin: Secondary | ICD-10-CM | POA: Diagnosis not present

## 2022-09-01 DIAGNOSIS — F10920 Alcohol use, unspecified with intoxication, uncomplicated: Secondary | ICD-10-CM | POA: Diagnosis not present

## 2022-09-01 NOTE — ED Notes (Signed)
Pt refusing any labwork in triage. Pt states "I dont have 12 hours to wait to see the doctor tonight".

## 2022-09-01 NOTE — ED Triage Notes (Addendum)
Pt to ED from home via EMS for right sided leg pain. Pt is obviously intoxicated and was just seen for same. Pt is CAOx4 and no acute distress and ambulatory in triage.   Pt states "tell the doctor my ass hurts".

## 2022-09-02 ENCOUNTER — Emergency Department
Admission: EM | Admit: 2022-09-02 | Discharge: 2022-09-02 | Disposition: A | Discharge status: Home / Self Care | Payer: BLUE CROSS/BLUE SHIELD | Attending: Emergency Medicine | Admitting: Emergency Medicine

## 2022-09-02 ENCOUNTER — Emergency Department: Payer: BLUE CROSS/BLUE SHIELD

## 2022-09-02 DIAGNOSIS — M79604 Pain in right leg: Secondary | ICD-10-CM

## 2022-09-02 DIAGNOSIS — R451 Restlessness and agitation: Secondary | ICD-10-CM

## 2022-09-02 DIAGNOSIS — F10920 Alcohol use, unspecified with intoxication, uncomplicated: Secondary | ICD-10-CM

## 2022-09-02 MED ORDER — IBUPROFEN 800 MG PO TABS: 800.0000 mg | ORAL_TABLET | Freq: Once | ORAL | Status: DC

## 2022-09-02 NOTE — ED Notes (Signed)
Pt started wondering the rooms, refusing to go back to her room. Security finally able to redirect pt back to her room.

## 2022-09-02 NOTE — Discharge Instructions (Addendum)
You may alternate Tylenol 1000 mg every 6 hours as needed for pain, fever and Ibuprofen 800 mg every 6-8 hours as needed for pain, fever.  Please take Ibuprofen with food.  Do not take more than 4000 mg of Tylenol (acetaminophen) in a 24 hour period.   Your x-rays today showed no acute abnormality.  The ultrasound of your right leg a week ago showed no DVT.  I recommend that you follow-up with the orthopedist as an outpatient for further evaluation for your right leg pain.

## 2022-09-02 NOTE — ED Provider Notes (Signed)
[facility] Provider Note    Event Date/Time   First MD Initiated Contact with Patient 09/02/22 0006     (approximate)   History   Leg Pain (right) and Alcohol Intoxication   HPI  Angela Landry is a 42 y.o. female with history of diabetes, thyroid disease, fibromyalgia, alcohol abuse who presents to the emergency department with complaints of right leg pain.  She denies any known injury but states this has been ongoing for a missed 2 weeks.  And was seen here on 08/25/2022 and had an ultrasound that showed no DVT but patient states that she is convinced there is a blood clot that we are not seeing.  She is requesting an MRI of her leg tonight.  States pain started in her calf and has now moved up to her thigh.  Patient is intoxicated, cursing at staff and difficult to redirect.   History provided by patient, EMS.    Past Medical History:  Diagnosis Date   Diabetes mellitus without complication (HCC)    Fibromyalgia    GERD (gastroesophageal reflux disease)    IBS (irritable bowel syndrome)    Thyroid disease     History reviewed. No pertinent surgical history.  MEDICATIONS:  Prior to Admission medications   Medication Sig Start Date End Date Taking? Authorizing Provider  ACCU-CHEK GUIDE test strip USE 1-4 TIMES DAILY AS DIRECTED BY PHYSICIAN TO MONITOR BLOOD SUGARS 10/10/20   [provider]  ARMOUR THYROID 120 MG tablet Take 120 mg by mouth every morning. 10/10/20   [provider]  ASPIRIN LOW DOSE 81 MG EC tablet Take 81 mg by mouth daily. 10/10/20   [provider]  cyclobenzaprine (FLEXERIL) 10 MG tablet Take 1 tablet (10 mg total) by mouth at bedtime. 10/15/21   White, Leitha Schuller, NP  DULoxetine (CYMBALTA) 60 MG capsule Take 60 mg by mouth daily. 10/10/20   [provider]  gabapentin (NEURONTIN) 300 MG capsule Take 3 capsules by mouth daily. 08/08/20   [provider]  levonorgestrel (MIRENA) 20 MCG/DAY IUD  by Intrauterine route.    [provider]  linaclotide Rolan Lipa) 290 MCG CAPS capsule  12/08/15   [provider]  meloxicam (MOBIC) 15 MG tablet Take 1 tablet by mouth daily as needed. 09/14/20   [provider]  naproxen sodium (ANAPROX DS) 550 MG tablet Take 1 tablet (550 mg total) by mouth 2 (two) times daily with a meal. 10/15/21   White, Adrienne R, NP  OZEMPIC, 0.25 OR 0.5 MG/DOSE, 2 MG/1.5ML SOPN Inject 0.5 mg into the skin once a week. 10/10/20   [provider]  pantoprazole (PROTONIX) 40 MG tablet Take 1 tablet by mouth daily. 10/10/20   [provider]  promethazine (PHENERGAN) 25 MG tablet Take 25 mg by mouth every 6 (six) hours as needed. 09/30/20   [provider]    Physical Exam   Triage Vital Signs: ED Triage Vitals  Enc Vitals Group     BP 09/01/22 2355 (!) 124/90     Pulse Rate 09/01/22 2355 (!) 120     Resp 09/01/22 2355 20     Temp 09/01/22 2355 98.7 F (37.1 C)     Temp Source 09/01/22 2355 Oral     SpO2 09/01/22 2355 96 %     Weight 09/01/22 2351 198 lb 6.6 oz (90 kg)     Height 09/01/22 2351 5\' 4"  (1.626 m)     Head Circumference --  Peak Flow --      Pain Score 09/01/22 2351 0     Pain Loc --      Pain Edu? --      Excl. in [place]? --     Most recent vital signs: Vitals:   09/01/22 2355  BP: (!) 124/90  Pulse: (!) 120  Resp: 20  Temp: 98.7 F (37.1 C)  SpO2: 96%     CONSTITUTIONAL: Alert and responds appropriately to questions.  Chronically ill-appearing, appears older than stated age, cursing, intoxicated HEAD: Normocephalic, atraumatic EYES: Conjunctivae clear, pupils appear equal ENT: normal nose; moist mucous membranes NECK: Normal range of motion CARD: Regular and tachycardic RESP: Normal chest excursion without splinting or tachypnea; no hypoxia or respiratory distress, speaking full sentences ABD/GI: non-distended EXT: Normal ROM in all joints, no major deformities noted, skin appears  normal without rash or overlying skin changes, 2+ right DP pulse, ambulating without difficulty, tender to palpation diffusely over the right leg and less distracted, normal cap refill, no calf swelling or asymmetry noted, compartments are soft in the right leg, extremities warm and well-perfused SKIN: Normal color for age and race, no rashes on exposed skin NEURO: Moves all extremities equally, normal speech, no facial asymmetry noted PSYCH: The patient's mood and manner are appropriate. Grooming and personal hygiene are appropriate.  ED Results / Procedures / Treatments   LABS: (all labs ordered are listed, but only abnormal results are displayed) Labs Reviewed - No data to display   EKG:  EKG Interpretation  Date/Time:    Ventricular Rate:    PR Interval:    QRS Duration:   QT Interval:    QTC Calculation:   R Axis:     Text Interpretation:            RADIOLOGY: My personal review and interpretation of imaging: X-rays show no acute abnormality.  I have personally reviewed all radiology reports. DG Hip Unilat W or Wo Pelvis 2-3 Views Right  Result Date: 09/02/2022 CLINICAL DATA:  diffuse right leg pain EXAM: DG HIP (WITH OR WITHOUT PELVIS) 2-3V RIGHT COMPARISON:  None Available. FINDINGS: There is no evidence of hip fracture or dislocation of the right hip. Front of the left hip unremarkable. No acute displaced fracture or diastasis of the bones of the pelvis. Mild degenerative changes of the right hips. T-shaped intrauterine device overlies the left sacrum with exact positioning unclear on radiograph. IMPRESSION: 1. Negative for acute traumatic injury. 2. T-shaped intrauterine device overlies the left sacrum with exact positioning unclear on radiograph. Electronically Signed   By: Iven Finn M.D.   On: 09/02/2022 01:12   DG Knee 2 Views Right  Result Date: 09/02/2022 CLINICAL DATA:  diffuse right leg pain EXAM: RIGHT KNEE - 1-2 VIEW COMPARISON:  None Available.  FINDINGS: No evidence of fracture, dislocation, or joint effusion. No evidence of arthropathy or other focal bone abnormality. Soft tissues are unremarkable. IMPRESSION: Negative. Electronically Signed   By: Iven Finn M.D.   On: 09/02/2022 01:06   DG Ankle 2 Views Right  Result Date: 09/02/2022 CLINICAL DATA:  diffuse right leg pain EXAM: RIGHT ANKLE - 2 VIEW COMPARISON:  None Available. FINDINGS: There is no evidence of fracture, dislocation, or joint effusion. Degenerative changes of the ankle. Soft tissues are unremarkable. IMPRESSION: No acute displaced fracture or dislocation. Electronically Signed   By: Iven Finn M.D.   On: 09/02/2022 01:06     PROCEDURES:  Critical Care performed: No  Procedures    IMPRESSION / MDM / ASSESSMENT AND PLAN / ED COURSE  I reviewed the triage vital signs and the nursing notes.   Patient here with complaints of right leg pain.  She is intoxicated, agitated, using foul language and difficult to redirect.     DIFFERENTIAL DIAGNOSIS (includes but not limited to):   Fracture, contusion, muscle strain, sprain, no signs of compartment syndrome, arterial obstruction, cellulitis, gout, septic arthritis, dislocation.  Recently had a negative ultrasound a week ago that showed no DVT.  Patient's presentation is most consistent with acute complicated illness / injury requiring diagnostic workup.  PLAN: Will obtain x-rays of her lower extremity.  Patient denies any injury but has history of alcohol abuse and recently her alcohol level was over 409 so is not outside the realm of possibility that she is injured herself while intoxicated.  She is requesting an MRI.  Discussed with patient that MRI would not be the appropriate modality to evaluate for DVT and given she had a negative ultrasound 6 days ago I do not think this needs to be repeated.  We did discuss that MRIs of the joints can be done as an outpatient and I will refer her to orthopedics.   Will give ibuprofen for pain.  Will avoid narcotics given she is intoxicated here.   Patient is tachycardic on arrival but it appears that this is normal for her when intoxicated and agitated.  MEDICATIONS GIVEN IN ED: Medications  ibuprofen (ADVIL) tablet 800 mg (800 mg Oral Patient Refused/Not Given 09/02/22 0047)     ED COURSE: X-rays reviewed and interpreted by myself and the radiologist and show no acute abnormality.  Patient continues to get up and walk out of her room without difficulty with normal gait multiple times stating that she is going to leave throughout her visit and has had to be redirected back to her room multiple times.  She is ambulating here with a steady gait and tolerating p.o.  At this time I do not feel we have any reason to hold her against her will and she is stating that she wants to leave.  Her staff has attempted to find her a safe ride home by contacting [place].  She has called family members as well while she has been here on the phone.  Will get outpatient orthopedic follow-up.  Again recommended over-the-counter medications for pain control given I do not feel narcotics are appropriate for this patient.  I feel would be quite dangerous for her to mix sedatives with alcohol.  I feel she is safe to be discharged and she can follow-up with outpatient providers for further workup for this pain.  There is no sign of any emergent condition present currently.  At this time, I do not feel there is any life-threatening condition present. I reviewed all nursing notes, vitals, pertinent previous records.  All lab and urine results, EKGs, imaging ordered have been independently reviewed and interpreted by myself.  I reviewed all available radiology reports from any imaging ordered this visit.  Based on my assessment, I feel the patient is safe to be discharged home without further emergent workup and can continue workup as an outpatient as needed. Discussed all findings, treatment  plan as well as usual and customary return precautions.  They verbalize understanding and are comfortable with this plan.  Outpatient follow-up has been provided as needed.  All questions have been answered.    CONSULTS:  none   OUTSIDE RECORDS REVIEWED:  Reviewed recent psychiatric notes at [facility] for alcohol use disorder.     FINAL CLINICAL IMPRESSION(S) / ED DIAGNOSES   Final diagnoses:  Alcoholic intoxication without complication (HCC)  Right leg pain  Agitation     Rx / DC Orders   ED Discharge Orders     None        Note:  This document was prepared using Dragon voice recognition software and may include unintentional dictation errors.   Taraji Mungo, Delice Bison, DO 09/02/22 [phone]

## 2022-09-02 NOTE — ED Notes (Signed)
Pt visualized ambulating out of room with no pants on at this time. Pt able to be redirected back to room by staff. Pt ambulated back to her room, requesting something to drink, pt given a drink by staff at this time.

## 2022-09-02 NOTE — ED Notes (Signed)
Pt moved from [facility] to room 5. X-ray at bedside to perform x-ray. This RN explained would go get ibuprofen as ordered by MD, pt states has taken her meloxicam, cymbalta and gabapentin without relief. Pt states "what's that fucking ibuprofen going to do for me? I want to talk to that damn doctor".

## 2022-09-02 NOTE — ED Notes (Signed)
Pt is ambulating all over the hall, her room and is belligerent.  She refused the ibuprofen, demanded her hydroxazine and something to drink.  Gave pt ice chips and water.

## 2023-04-02 ENCOUNTER — Ambulatory Visit
Admission: EM | Admit: 2023-04-02 | Discharge: 2023-04-02 | Disposition: A | Discharge status: Home / Self Care | Payer: Medicaid Other | Attending: Emergency Medicine | Admitting: Emergency Medicine

## 2023-04-02 ENCOUNTER — Ambulatory Visit (INDEPENDENT_AMBULATORY_CARE_PROVIDER_SITE_OTHER): Payer: Medicaid Other

## 2023-04-02 DIAGNOSIS — J0100 Acute maxillary sinusitis, unspecified: Secondary | ICD-10-CM | POA: Diagnosis not present

## 2023-04-02 DIAGNOSIS — R052 Subacute cough: Secondary | ICD-10-CM | POA: Diagnosis not present

## 2023-04-02 MED ORDER — AEROCHAMBER MV MISC: 1 refills | Status: DC

## 2023-04-02 MED ORDER — IPRATROPIUM BROMIDE 0.06 % NA SOLN: 2.0000 | Freq: Four times a day (QID) | NASAL | 0 refills | Status: DC

## 2023-04-02 MED ORDER — ALBUTEROL SULFATE HFA 108 (90 BASE) MCG/ACT IN AERS: 1.0000 | INHALATION_SPRAY | RESPIRATORY_TRACT | 0 refills | Status: DC | PRN

## 2023-04-02 MED ORDER — MOXIFLOXACIN HCL 400 MG PO TABS: 400.0000 mg | ORAL_TABLET | Freq: Every day | ORAL | 0 refills | Status: AC

## 2023-04-02 MED ORDER — PROMETHAZINE-DM 6.25-15 MG/5ML PO SYRP: 5.0000 mL | ORAL_SOLUTION | Freq: Four times a day (QID) | ORAL | 0 refills | Status: DC | PRN

## 2023-04-02 NOTE — ED Triage Notes (Signed)
Patient states that she's had a cough and nasal congestion for 1.5 months. She's taking Flonase and zyrtec. Patient states that the cough is worse at night. Patient states that she vomits mucus. She's been on and abx and steroid that she finished last week. Doxy and prednisone.

## 2023-04-02 NOTE — ED Provider Notes (Signed)
HPI  SUBJECTIVE:  Angela Landry is a 42 y.o. female who presents with 6 weeks of nasal congestion, rhinorrhea, postnasal drip, dry cough, occasional posttussive emesis productive of clear mucus.  She saw her PCP initially, was, Tessalon, Flonase and got better.  She had a return visit with her PCP on 10/9 for cough and congestion, headache.  She was thought to have acute sinusitis, sent home with Medrol Dosepak, doxycycline for 7 days, which she finished on 10/16.  States that she got better and then got worse.  She reports diffuse bodyaches, laryngitis secondary to the cough, left maxillary sinus pain and pressure starting over the past few days, continue clear rhinorrhea, bilateral lower rib pain described as soreness secondary to cough, sore throat, wheezing.  She has not yet had a chest x-ray.  Has a past medical history of diabetes, fibromyalgia, GERD on omeprazole, hypertension on lisinopril, irritable bowel syndrome, hypothyroidism.  PCP: [facility].  Past Medical History:  Diagnosis Date   Diabetes mellitus without complication (HCC)    Fibromyalgia    GERD (gastroesophageal reflux disease)    IBS (irritable bowel syndrome)    Thyroid disease     History reviewed. No pertinent surgical history.  History reviewed. No pertinent family history.  Social History   Tobacco Use   Smoking status: Every Day    Types: Cigarettes   Smokeless tobacco: Never  Vaping Use   Vaping status: Never Used  Substance Use Topics   Alcohol use: Yes   Drug use: Never    No current facility-administered medications for this encounter.  Current Outpatient Medications:    albuterol (VENTOLIN HFA) 108 (90 Base) MCG/ACT inhaler, Inhale 1-2 puffs into the lungs every 4 (four) hours as needed for wheezing or shortness of breath., Disp: 1 each, Rfl: 0   ARMOUR THYROID 120 MG tablet, Take 120 mg by mouth every morning., Disp: , Rfl:    ASPIRIN LOW DOSE 81 MG EC tablet, Take 81 mg by mouth  daily., Disp: , Rfl:    DULoxetine (CYMBALTA) 60 MG capsule, Take 60 mg by mouth daily., Disp: , Rfl:    gabapentin (NEURONTIN) 300 MG capsule, Take 3 capsules by mouth daily., Disp: , Rfl:    ipratropium (ATROVENT) 0.06 % nasal spray, Place 2 sprays into both nostrils 4 (four) times daily., Disp: 15 mL, Rfl: 0   levonorgestrel (MIRENA) 20 MCG/DAY IUD, by Intrauterine route., Disp: , Rfl:    lisinopril (ZESTRIL) 10 MG tablet, Take 1 tablet by mouth daily., Disp: , Rfl:    metFORMIN (GLUCOPHAGE) 500 MG tablet, Take by mouth., Disp: , Rfl:    moxifloxacin (AVELOX) 400 MG tablet, Take 1 tablet (400 mg total) by mouth daily at 8 pm for 7 days., Disp: 7 tablet, Rfl: 0   norethindrone (MICRONOR) 0.35 MG tablet, Take 1 tablet by mouth daily., Disp: , Rfl:    pantoprazole (PROTONIX) 40 MG tablet, Take 1 tablet by mouth daily., Disp: , Rfl:    promethazine-dextromethorphan (PROMETHAZINE-DM) 6.25-15 MG/5ML syrup, Take 5 mLs by mouth 4 (four) times daily as needed for cough., Disp: 118 mL, Rfl: 0   Spacer/Aero-Holding Chambers (AEROCHAMBER MV) inhaler, Use as instructed, Disp: 1 each, Rfl: 1   tranexamic acid (LYSTEDA) 650 MG TABS tablet, Take by mouth., Disp: , Rfl:    traZODone (DESYREL) 50 MG tablet, TAKE 1/2 TO 1 TABLET BY MOUTH EVERY NIGHT 1 HOUR BEFORE BEDTIME, Disp: , Rfl:    ACCU-CHEK GUIDE test strip, USE 1-4 TIMES  DAILY AS DIRECTED BY PHYSICIAN TO MONITOR BLOOD SUGARS, Disp: , Rfl:    linaclotide (LINZESS) 290 MCG CAPS capsule, , Disp: , Rfl:    naproxen sodium (ANAPROX DS) 550 MG tablet, Take 1 tablet (550 mg total) by mouth 2 (two) times daily with a meal., Disp: 30 tablet, Rfl: 0   OZEMPIC, 0.25 OR 0.5 MG/DOSE, 2 MG/1.5ML SOPN, Inject 0.5 mg into the skin once a week., Disp: , Rfl:    promethazine (PHENERGAN) 25 MG tablet, Take 25 mg by mouth every 6 (six) hours as needed., Disp: , Rfl:   No Known Allergies   ROS  As noted in HPI.   Physical Exam  BP (!) 154/84 (BP Location: Right Arm)    Pulse 77   Temp 98.1 F (36.7 C)   Resp 18   LMP 03/31/2023 (Exact Date)   SpO2 98%   Constitutional: Well developed, well nourished, no acute distress Eyes:  EOMI, conjunctiva normal bilaterally HENT: Normocephalic, atraumatic,mucus membranes moist.  Erythematous, swollen turbinates.  Positive clear nasal congestion.  Positive maxillary sinus tenderness.  No frontal sinus tenderness.  No postnasal drip. Respiratory: Normal inspiratory effort, lungs clear bilaterally, good air movement.  Positive lateral chest wall tenderness Cardiovascular: Normal rate, regular rhythm, no murmurs rubs or gallops GI: nondistended skin: No rash, skin intact Musculoskeletal: no deformities Neurologic: Alert & oriented x 3, no focal neuro deficits Psychiatric: Speech and behavior appropriate   ED Course   Medications - No data to display  Orders Placed This Encounter  Procedures   DG Chest 2 View    Standing Status:   Standing    Number of Occurrences:   1    Order Specific Question:   Reason for Exam (SYMPTOM  OR DIAGNOSIS REQUIRED)    Answer:   Cough for 6 weeks, rule out pneumonia, pulmonary edema, pleural effusion.    No results found for this or any previous visit (from the past 24 hour(s)). DG Chest 2 View  Result Date: 04/02/2023 CLINICAL DATA:  Cough for 6 weeks EXAM: CHEST - 2 VIEW COMPARISON:  CTA chest 08/25/2022 FINDINGS: The cardiomediastinal silhouette is normal There is no focal consolidation or pulmonary edema. There is no pleural effusion or pneumothorax There is no acute osseous abnormality. IMPRESSION: No radiographic evidence of acute cardiopulmonary process. Electronically Signed   By: Lesia Hausen M.D.   On: 04/02/2023 13:19    ED Clinical Impression  1. Acute non-recurrent maxillary sinusitis   2. Subacute cough      ED Assessment/Plan     Outside records reviewed.  As noted in HPI.  Will check chest x-ray as patient has had symptoms for 6 weeks and has not  yet had an x-ray.  Reviewed imaging independently.  Questionable opacity right lower lung as read by me.  Will contact patient at [phone] if radiology overread differs enough from my reading we need to change management.  Radiology report reviewed.  No acute cardiopulmonary process per radiology.  Patient with 6 weeks of a cough, she has maxillary sinus tenderness, and is reporting extensive postnasal drip.  She is a smoker, has a history of hypertension and is on lisinopril, all of which can cause a cough.  Discussed this with patient.  Will have her discontinue the Zyrtec as it is not helping and have her start Mucinex.  Discontinue Flonase and start Atrovent nasal spray.  Saline nasal irrigation, Promethazine DM for cough.  Regularly scheduled albuterol inhaler with a spacer for 4  days, then as needed.  Will send home with a wait-and-see prescription of moxifloxacin 400 mg daily for 7 days since she failed doxycycline-she will start this if she does not improve in several day.  Went over black warnings with patient.  Send patient mychart note advising her to continue current plan- supportive  tx first, then abx if no improvement-  even though radiology did not see a pneumonia on x-ray  Discussed imaging, MDM, treatment plan, and plan for follow-up with patient. Discussed sn/sx that should prompt return to the ED. patient agrees with plan.   Meds ordered this encounter  Medications   moxifloxacin (AVELOX) 400 MG tablet    Sig: Take 1 tablet (400 mg total) by mouth daily at 8 pm for 7 days.    Dispense:  7 tablet    Refill:  0   albuterol (VENTOLIN HFA) 108 (90 Base) MCG/ACT inhaler    Sig: Inhale 1-2 puffs into the lungs every 4 (four) hours as needed for wheezing or shortness of breath.    Dispense:  1 each    Refill:  0   Spacer/Aero-Holding Chambers (AEROCHAMBER MV) inhaler    Sig: Use as instructed    Dispense:  1 each    Refill:  1   ipratropium (ATROVENT) 0.06 % nasal spray     Sig: Place 2 sprays into both nostrils 4 (four) times daily.    Dispense:  15 mL    Refill:  0   promethazine-dextromethorphan (PROMETHAZINE-DM) 6.25-15 MG/5ML syrup    Sig: Take 5 mLs by mouth 4 (four) times daily as needed for cough.    Dispense:  118 mL    Refill:  0      *This clinic note was created using Scientist, clinical (histocompatibility and immunogenetics). Therefore, there may be occasional mistakes despite careful proofreading.  ?    Domenick Gong, MD 04/03/23 [phone]

## 2023-04-02 NOTE — Discharge Instructions (Signed)
Your x-ray showed that you have a questionable opacity in your right lower lobe, but it is not clearly a pneumonia.  Discontinue Zyrtec, start Mucinex instead.  Stop the Flonase and try Atrovent nasal spray for the nasal congestion and postnasal drip.  Saline nasal irrigation with a Lloyd Huger Med rinse and distilled water as often as you want to help with the nasal congestion and postnasal drip.  Promethazine DM for cough.  2 puffs from your albuterol inhaler using your spacer every 4 hours for 2 days, then every 6 hours for 2 days, then as needed.  I am sending home with moxifloxacin which will take care of a pneumonia as well as a sinus infection.

## 2023-05-24 ENCOUNTER — Ambulatory Visit: Payer: Medicaid Other

## 2023-05-28 ENCOUNTER — Emergency Department
Admission: EM | Admit: 2023-05-28 | Discharge: 2023-05-28 | Disposition: A | Discharge status: Home / Self Care | Payer: Medicaid Other | Attending: Emergency Medicine | Admitting: Emergency Medicine

## 2023-05-28 ENCOUNTER — Other Ambulatory Visit: Payer: Self-pay

## 2023-05-28 ENCOUNTER — Emergency Department: Payer: Medicaid Other

## 2023-05-28 ENCOUNTER — Encounter: Payer: Self-pay | Admitting: *Deleted

## 2023-05-28 DIAGNOSIS — J3489 Other specified disorders of nose and nasal sinuses: Secondary | ICD-10-CM

## 2023-05-28 DIAGNOSIS — J029 Acute pharyngitis, unspecified: Secondary | ICD-10-CM | POA: Insufficient documentation

## 2023-05-28 DIAGNOSIS — R0989 Other specified symptoms and signs involving the circulatory and respiratory systems: Secondary | ICD-10-CM | POA: Insufficient documentation

## 2023-05-28 DIAGNOSIS — Y908 Blood alcohol level of 240 mg/100 ml or more: Secondary | ICD-10-CM | POA: Diagnosis not present

## 2023-05-28 DIAGNOSIS — R0602 Shortness of breath: Secondary | ICD-10-CM | POA: Diagnosis not present

## 2023-05-28 DIAGNOSIS — E119 Type 2 diabetes mellitus without complications: Secondary | ICD-10-CM | POA: Insufficient documentation

## 2023-05-28 DIAGNOSIS — Z20822 Contact with and (suspected) exposure to covid-19: Secondary | ICD-10-CM | POA: Diagnosis not present

## 2023-05-28 DIAGNOSIS — F10920 Alcohol use, unspecified with intoxication, uncomplicated: Secondary | ICD-10-CM

## 2023-05-28 DIAGNOSIS — F10120 Alcohol abuse with intoxication, uncomplicated: Secondary | ICD-10-CM | POA: Insufficient documentation

## 2023-05-28 LAB — ETHANOL: Alcohol, Ethyl (B): 424 mg/dL (ref <10)

## 2023-05-28 LAB — CBC WITH DIFFERENTIAL/PLATELET
Abs Immature Granulocytes: 0.01 10*3/uL (ref 0.00–0.07)
Basophils Absolute: 0 10*3/uL (ref 0.0–0.1)
Basophils Relative: 1 %
Eosinophils Absolute: 0 10*3/uL (ref 0.0–0.5)
Eosinophils Relative: 0 %
HCT: 38.6 % (ref 36.0–46.0)
Hemoglobin: 13.7 g/dL (ref 12.0–15.0)
Immature Granulocytes: 0 %
Lymphocytes Relative: 49 %
Lymphs Abs: 2.2 10*3/uL (ref 0.7–4.0)
MCH: 31.9 pg (ref 26.0–34.0)
MCHC: 35.5 g/dL (ref 30.0–36.0)
MCV: 89.8 fL (ref 80.0–100.0)
Monocytes Absolute: 0.3 10*3/uL (ref 0.1–1.0)
Monocytes Relative: 7 %
Neutro Abs: 2 10*3/uL (ref 1.7–7.7)
Neutrophils Relative %: 43 %
Platelets: 288 10*3/uL (ref 150–400)
RBC: 4.3 MIL/uL (ref 3.87–5.11)
RDW: 14 % (ref 11.5–15.5)
WBC: 4.5 10*3/uL (ref 4.0–10.5)
nRBC: 0 % (ref 0.0–0.2)

## 2023-05-28 LAB — COMPREHENSIVE METABOLIC PANEL
ALT: 14 U/L (ref 0–44)
AST: 21 U/L (ref 15–41)
Albumin: 3.5 g/dL (ref 3.5–5.0)
Alkaline Phosphatase: 100 U/L (ref 38–126)
Anion gap: 17 — ABNORMAL HIGH (ref 5–15)
BUN: 10 mg/dL (ref 6–20)
CO2: 22 mmol/L (ref 22–32)
Calcium: 8 mg/dL — ABNORMAL LOW (ref 8.9–10.3)
Chloride: 92 mmol/L — ABNORMAL LOW (ref 98–111)
Creatinine, Ser: 0.54 mg/dL (ref 0.44–1.00)
GFR, Estimated: >60 mL/min (ref >60)
Glucose, Bld: 252 mg/dL — ABNORMAL HIGH (ref 70–99)
Potassium: 3.6 mmol/L (ref 3.5–5.1)
Sodium: 131 mmol/L — ABNORMAL LOW (ref 135–145)
Total Bilirubin: 0.7 mg/dL (ref <1.2)
Total Protein: 7.3 g/dL (ref 6.5–8.1)

## 2023-05-28 LAB — LACTIC ACID, PLASMA: Lactic Acid, Venous: 6.3 mmol/L (ref 0.5–1.9)

## 2023-05-28 LAB — RESP PANEL BY RT-PCR (RSV, FLU A&B, COVID)  RVPGX2
Influenza A by PCR: NEGATIVE
Influenza B by PCR: NEGATIVE
Resp Syncytial Virus by PCR: NEGATIVE
SARS Coronavirus 2 by RT PCR: NEGATIVE

## 2023-05-28 LAB — TROPONIN I (HIGH SENSITIVITY): Troponin I (High Sensitivity): 6 ng/L (ref <18)

## 2023-05-28 LAB — URINALYSIS, ROUTINE W REFLEX MICROSCOPIC
Bacteria, UA: NONE SEEN
Bilirubin Urine: NEGATIVE
Glucose, UA: >=500 mg/dL — AB
Ketones, ur: NEGATIVE mg/dL
Leukocytes,Ua: NEGATIVE
Nitrite: NEGATIVE
Protein, ur: NEGATIVE mg/dL
Specific Gravity, Urine: 1.013 (ref 1.005–1.030)
pH: 5 (ref 5.0–8.0)

## 2023-05-28 LAB — GROUP A STREP BY PCR: Group A Strep by PCR: NOT DETECTED

## 2023-05-28 MED ORDER — LORAZEPAM 2 MG/ML IJ SOLN: 2.0000 mg | Freq: Once | INTRAMUSCULAR | Status: DC

## 2023-05-28 MED ORDER — LORAZEPAM 2 MG/ML IJ SOLN: 2.0000 mg | Freq: Once | INTRAMUSCULAR | Status: AC

## 2023-05-28 MED ORDER — LORAZEPAM 2 MG/ML IJ SOLN
INTRAMUSCULAR | Status: AC
  Administered 2023-05-28: 2 mg via INTRAMUSCULAR
  Filled 2023-05-28: qty 1

## 2023-05-28 MED ORDER — CHLORASEPTIC GARGLE 1.4 % MT LIQD: 1.0000 | OROMUCOSAL | 0 refills | Status: DC | PRN

## 2023-05-28 MED ORDER — HALOPERIDOL LACTATE 5 MG/ML IJ SOLN: 5.0000 mg | Freq: Once | INTRAMUSCULAR | Status: DC

## 2023-05-28 MED ORDER — DIPHENHYDRAMINE HCL 50 MG/ML IJ SOLN: 50.0000 mg | Freq: Once | INTRAMUSCULAR | Status: DC

## 2023-05-28 NOTE — ED Notes (Signed)
Patient's brother called by this RN- he is unable to find a ride for patient at this time. He reports if anything changes he will let us know, he is still trying to find someone to come get her.

## 2023-05-28 NOTE — ED Notes (Signed)
MD Larinda Buttery informed of pt lactic of 6.3.

## 2023-05-28 NOTE — ED Notes (Signed)
Sitter at bedside for patient safety due to intoxication and attempting to wonder around the unit

## 2023-05-28 NOTE — ED Provider Notes (Signed)
[facility] Provider Note   None    (approximate) History  Alcohol Intoxication  HPI Angela Landry is a 42 y.o. female with a past medical history of alcohol abuse, thyroid disease, and type 2 diabetes who presents via EMS from home severely intoxicated complaining of shortness of breath and sore throat.  Patient was reportedly aggressive towards staff and was brought back to a room for further evaluation and possible involuntary commitment.  Patient explains that she has been experiencing the symptoms for the past 3 weeks however they have worsened today.  Further history and review of systems are unable to be obtained secondary to patient's mental status ROS: Unable to assess   Physical Exam  Triage Vital Signs: ED Triage Vitals  Encounter Vitals Group     BP 05/28/23 1529 (!) 156/100     Systolic BP Percentile --      Diastolic BP Percentile --      Pulse Rate 05/28/23 1529 (!) 125     Resp 05/28/23 1529 (!) 26     Temp 05/28/23 1529 98.6 F (37 C)     Temp Source 05/28/23 1529 Oral     SpO2 05/28/23 1529 100 %     Weight 05/28/23 1527 198 lb 6.6 oz (90 kg)     Height 05/28/23 1527 5\' 4"  (1.626 m)     Head Circumference --      Peak Flow --      Pain Score 05/28/23 1527 5     Pain Loc --      Pain Education --      Exclude from Growth Chart --    Most recent vital signs: Vitals:   05/28/23 1529 05/28/23 1655  BP: (!) 156/100   Pulse: (!) 125   Resp: (!) 26   Temp: 98.6 F (37 C)   SpO2: 100% 100%   General: Awake, oriented x4. CV:  Good peripheral perfusion.  Resp:  Normal effort.  Abd:  No distention.  Other:  Middle-aged obese African-American female agitated and occasionally verbally aggressive with staff.  Erythematous posterior oropharynx ED Results / Procedures / Treatments  Labs (all labs ordered are listed, but only abnormal results are displayed) Labs Reviewed  ETHANOL - Abnormal; Notable for the following components:       Result Value   Alcohol, Ethyl (B) 424 (*)    All other components within normal limits  COMPREHENSIVE METABOLIC PANEL - Abnormal; Notable for the following components:   Sodium 131 (*)    Chloride 92 (*)    Glucose, Bld 252 (*)    Calcium 8.0 (*)    Anion gap 17 (*)    All other components within normal limits  URINALYSIS, ROUTINE W REFLEX MICROSCOPIC - Abnormal; Notable for the following components:   Color, Urine YELLOW (*)    APPearance CLEAR (*)    Glucose, UA >=500 (*)    Hgb urine dipstick SMALL (*)    All other components within normal limits  LACTIC ACID, PLASMA - Abnormal; Notable for the following components:   Lactic Acid, Venous 6.3 (*)    All other components within normal limits  GROUP A STREP BY PCR  RESP PANEL BY RT-PCR (RSV, FLU A&B, COVID)  [id number]  CBC WITH DIFFERENTIAL/PLATELET  LACTIC ACID, PLASMA  TROPONIN I (HIGH SENSITIVITY)  TROPONIN I (HIGH SENSITIVITY)   EKG ED ECG REPORT I, Merwyn Katos, the attending physician, personally viewed and interpreted this ECG. Date: 05/28/2023 EKG  Time: 1548 Rate: 114 Rhythm: Tachycardic sinus rhythm QRS Axis: normal Intervals: normal ST/T Wave abnormalities: normal Narrative Interpretation: Tachycardic sinus rhythm.  No evidence of acute ischemia PROCEDURES: Critical Care performed: No Procedures MEDICATIONS ORDERED IN ED: Medications  LORazepam (ATIVAN) injection 2 mg (2 mg Intramuscular Given 05/28/23 1649)   IMPRESSION / MDM / ASSESSMENT AND PLAN / ED COURSE  I reviewed the triage vital signs and the nursing notes.                             The patient is on the cardiac monitor to evaluate for evidence of arrhythmia and/or significant heart rate changes. Patient's presentation is most consistent with acute presentation with potential threat to life or bodily function. Presents with altered mental status complaining of sore throat and shortness of breath of the last 3 weeks Stated EtOH  intoxication. Airway maintained. Unlikely intracranial bleed, opioid intoxication or coingestion, sepsis, hypothyroidism. Suspect likely transient course of intoxication with expected  improvement of symptoms as patient metabolizes offending agent. Patient's laboratory abnormalities include alcohol of 424 and lactic acid of 6.3.  This lactic acidosis is likely explained by patient's alcohol level as patient has no leukocytosis, hypotension, or fever concerning for infection.  Patient's EKG shows no signs of abnormality or ischemia. Plan: frequent reassessments  Reassessment Note: Time: 2 hours since initial presentation. Evaluation: Frequent mental status exams showed improving symptoms and evidence that the patient's AMS was secondary to intoxication. Pt able to ambulate without difficulty and PO tolerant. Plan DC home with ride and return precautions. Disposition: Discharge home    FINAL CLINICAL IMPRESSION(S) / ED DIAGNOSES   Final diagnoses:  Alcoholic intoxication without complication (HCC)  Sore throat  Rhinorrhea   Rx / DC Orders   ED Discharge Orders          Ordered    phenol (CHLORASEPTIC GARGLE) 1.4 % LIQD  As needed        05/28/23 1703           Note:  This document was prepared using Dragon voice recognition software and may include unintentional dictation errors.   Merwyn Katos, MD 05/28/23 [phone]

## 2023-05-28 NOTE — ED Notes (Signed)
Pt trying to fall out of wheelchair. Pt is heavily intoxicated. RN Amy voiced concerns for pt fall risk and safety. Charge RN Marylene Land called and informed. Per Charge pull a tech from triage to sit with pt.

## 2023-05-28 NOTE — ED Notes (Signed)
Pt placed in recliner and informed of staff sitting with pt. Pt taken to Pod D wait. Pt stating to staff that she is going to act out. Security called by this RN to come and assist.

## 2023-05-28 NOTE — ED Notes (Signed)
Pt yelling at staff and security and MD Larinda Buttery causing a scene in front of other pts. This RN heard the yelling and went to assist. Security at recliner with pt and MD [place]. Per MD pt needs to be moved and possible medicated. Charge RN called and informed of update on pt. Plans for pt to be moved to ED 23 shortly. STaff updated on plan.  Pt taken to family wait for time being.

## 2023-05-28 NOTE — ED Provider Triage Note (Addendum)
Emergency Medicine Provider Triage Evaluation Note  Angela Landry , a 42 y.o. female  was evaluated in triage.  Pt complains of sore throat and ETOH on board. EMS reports that patient claimed she was recently diagnosed with pneumonia, she called EMS because she felt like she couldn't breathe.   Review of Systems  Positive: Sore throat, SOB Negative:   Physical Exam  There were no vitals taken for this visit. Gen:   Awake, uncooperative Resp:  Normal effort  MSK:   Moves extremities without difficulty  Other:    Medical Decision Making  Medically screening exam initiated at 3:18 PM.  Appropriate orders placed.  Gerardine Band was informed that the remainder of the evaluation will be completed by another provider, this initial triage assessment does not replace that evaluation, and the importance of remaining in the ED until their evaluation is complete.     Cameron Ali, PA-C 05/28/23 1525    Cameron Ali, PA-C 05/28/23 1527    Cameron Ali, PA-C 05/28/23 1529

## 2023-05-28 NOTE — ED Triage Notes (Signed)
Pt brought in via ems from home.  Pt in a wheelchair, intoxicated.    Pt reports has sob and pt has a sore throat.  Pt refused vital signs per ems.

## 2023-05-28 NOTE — ED Notes (Signed)
MD Larinda Buttery informed of alcohol level of 424

## 2023-05-28 NOTE — ED Notes (Signed)
Per EDT [place] pt is trying to fight Ship broker. Pt trying to get out of the family wait room. This RN called [employer] and informed her of what was going on. Per Charge she is on the way.

## 2023-05-29 ENCOUNTER — Other Ambulatory Visit: Payer: Self-pay

## 2023-05-29 ENCOUNTER — Emergency Department: Payer: Medicaid Other

## 2023-05-29 ENCOUNTER — Emergency Department
Admission: EM | Admit: 2023-05-29 | Discharge: 2023-05-29 | Discharge status: Against Medical Advice | Payer: Medicaid Other | Attending: Student | Admitting: Student

## 2023-05-29 DIAGNOSIS — Z5321 Procedure and treatment not carried out due to patient leaving prior to being seen by health care provider: Secondary | ICD-10-CM | POA: Insufficient documentation

## 2023-05-29 DIAGNOSIS — Y909 Presence of alcohol in blood, level not specified: Secondary | ICD-10-CM | POA: Insufficient documentation

## 2023-05-29 DIAGNOSIS — J029 Acute pharyngitis, unspecified: Secondary | ICD-10-CM | POA: Insufficient documentation

## 2023-05-29 DIAGNOSIS — F10120 Alcohol abuse with intoxication, uncomplicated: Secondary | ICD-10-CM | POA: Diagnosis not present

## 2023-05-29 DIAGNOSIS — M79604 Pain in right leg: Secondary | ICD-10-CM | POA: Insufficient documentation

## 2023-05-29 NOTE — ED Triage Notes (Signed)
Pt comes via EMs from home c/o cp. Pt was seen here yesterday for alcohol intoxication. Pt states mid sternal.  Pt states she can't breath.   Pt is intoxicated today also. Pt drank half big bottle of some bourbon.

## 2023-05-29 NOTE — ED Notes (Signed)
Pt out wondering the parking lot per [employer] and ED Secretary.

## 2023-05-29 NOTE — ED Notes (Signed)
No answer when called several times from lobby 

## 2023-05-29 NOTE — ED Triage Notes (Signed)
Pt presents to ED under the influence of ETOH, pt also has c/o of CP, pt states HX of lots but does take a blood thinner but takes ASA. No ASA today.

## 2023-05-29 NOTE — ED Notes (Signed)
Pt cussing at staff and being uncooperative in triage due to and not allowing 2 RN's to draw blood.

## 2023-05-29 NOTE — ED Provider Triage Note (Signed)
Emergency Medicine Provider Triage Evaluation Note  Angela Landry , a 42 y.o. female  was evaluated in triage.  Pt complains of right leg pain. Reports history of blood clot in her leg, takes aspirin only. Also reports sore throat. Seen yesterday. Denies CP currently.  Review of Systems  Positive: Right leg pain Negative: CP/SOB  Physical Exam  There were no vitals taken for this visit. Gen:   Awake, no distress   Resp:  Normal effort  MSK:   Moves extremities without difficulty  Other:    Medical Decision Making  Medically screening exam initiated at 4:43 PM.  Appropriate orders placed.  Angela Landry was informed that the remainder of the evaluation will be completed by another provider, this initial triage assessment does not replace that evaluation, and the importance of remaining in the ED until their evaluation is complete.     Jackelyn Hoehn, PA-C 05/29/23 [phone]

## 2023-05-30 ENCOUNTER — Emergency Department
Admission: EM | Admit: 2023-05-30 | Discharge: 2023-05-30 | Disposition: A | Discharge status: Home / Self Care | Payer: Medicaid Other | Attending: Emergency Medicine | Admitting: Emergency Medicine

## 2023-05-30 ENCOUNTER — Encounter: Payer: Self-pay | Admitting: Emergency Medicine

## 2023-05-30 DIAGNOSIS — Y908 Blood alcohol level of 240 mg/100 ml or more: Secondary | ICD-10-CM | POA: Insufficient documentation

## 2023-05-30 DIAGNOSIS — F1010 Alcohol abuse, uncomplicated: Secondary | ICD-10-CM | POA: Diagnosis present

## 2023-05-30 DIAGNOSIS — R000 Tachycardia, unspecified: Secondary | ICD-10-CM | POA: Diagnosis not present

## 2023-05-30 DIAGNOSIS — M79604 Pain in right leg: Secondary | ICD-10-CM | POA: Diagnosis not present

## 2023-05-30 DIAGNOSIS — R0789 Other chest pain: Secondary | ICD-10-CM | POA: Insufficient documentation

## 2023-05-30 LAB — CBG MONITORING, ED: Glucose-Capillary: 197 mg/dL — ABNORMAL HIGH (ref 70–99)

## 2023-05-30 LAB — CBC WITH DIFFERENTIAL/PLATELET
Abs Immature Granulocytes: 0.01 10*3/uL (ref 0.00–0.07)
Basophils Absolute: 0 10*3/uL (ref 0.0–0.1)
Basophils Relative: 1 %
Eosinophils Absolute: 0 10*3/uL (ref 0.0–0.5)
Eosinophils Relative: 0 %
HCT: 36.9 % (ref 36.0–46.0)
Hemoglobin: 13.5 g/dL (ref 12.0–15.0)
Immature Granulocytes: 0 %
Lymphocytes Relative: 32 %
Lymphs Abs: 1.8 10*3/uL (ref 0.7–4.0)
MCH: 32.5 pg (ref 26.0–34.0)
MCHC: 36.6 g/dL — ABNORMAL HIGH (ref 30.0–36.0)
MCV: 88.7 fL (ref 80.0–100.0)
Monocytes Absolute: 0.4 10*3/uL (ref 0.1–1.0)
Monocytes Relative: 7 %
Neutro Abs: 3.4 10*3/uL (ref 1.7–7.7)
Neutrophils Relative %: 60 %
Platelets: 278 10*3/uL (ref 150–400)
RBC: 4.16 MIL/uL (ref 3.87–5.11)
RDW: 13.8 % (ref 11.5–15.5)
WBC: 5.6 10*3/uL (ref 4.0–10.5)
nRBC: 0 % (ref 0.0–0.2)

## 2023-05-30 LAB — COMPREHENSIVE METABOLIC PANEL
ALT: 15 U/L (ref 0–44)
AST: 23 U/L (ref 15–41)
Albumin: 3.5 g/dL (ref 3.5–5.0)
Alkaline Phosphatase: 93 U/L (ref 38–126)
Anion gap: 21 — ABNORMAL HIGH (ref 5–15)
BUN: 11 mg/dL (ref 6–20)
CO2: 20 mmol/L — ABNORMAL LOW (ref 22–32)
Calcium: 8.3 mg/dL — ABNORMAL LOW (ref 8.9–10.3)
Chloride: 90 mmol/L — ABNORMAL LOW (ref 98–111)
Creatinine, Ser: 0.65 mg/dL (ref 0.44–1.00)
GFR, Estimated: >60 mL/min (ref >60)
Glucose, Bld: 209 mg/dL — ABNORMAL HIGH (ref 70–99)
Potassium: 3.3 mmol/L — ABNORMAL LOW (ref 3.5–5.1)
Sodium: 131 mmol/L — ABNORMAL LOW (ref 135–145)
Total Bilirubin: 1.1 mg/dL (ref <1.2)
Total Protein: 7 g/dL (ref 6.5–8.1)

## 2023-05-30 LAB — LIPASE, BLOOD: Lipase: 76 U/L — ABNORMAL HIGH (ref 11–51)

## 2023-05-30 LAB — BASIC METABOLIC PANEL
Anion gap: 18 — ABNORMAL HIGH (ref 5–15)
BUN: 10 mg/dL (ref 6–20)
CO2: 21 mmol/L — ABNORMAL LOW (ref 22–32)
Calcium: 7.7 mg/dL — ABNORMAL LOW (ref 8.9–10.3)
Chloride: 93 mmol/L — ABNORMAL LOW (ref 98–111)
Creatinine, Ser: 0.61 mg/dL (ref 0.44–1.00)
GFR, Estimated: >60 mL/min (ref >60)
Glucose, Bld: 184 mg/dL — ABNORMAL HIGH (ref 70–99)
Potassium: 3.4 mmol/L — ABNORMAL LOW (ref 3.5–5.1)
Sodium: 132 mmol/L — ABNORMAL LOW (ref 135–145)

## 2023-05-30 LAB — TROPONIN I (HIGH SENSITIVITY)
Troponin I (High Sensitivity): 10 ng/L (ref <18)
Troponin I (High Sensitivity): 10 ng/L (ref <18)
Troponin I (High Sensitivity): 11 ng/L (ref <18)

## 2023-05-30 LAB — ETHANOL: Alcohol, Ethyl (B): 343 mg/dL (ref <10)

## 2023-05-30 MED ORDER — LACTATED RINGERS IV BOLUS
1000.0000 mL | Freq: Once | INTRAVENOUS | Status: AC
  Administered 2023-05-30: 1000 mL via INTRAVENOUS

## 2023-05-30 MED ORDER — SODIUM CHLORIDE 0.9 % IV BOLUS
1000.0000 mL | Freq: Once | INTRAVENOUS | Status: AC
  Administered 2023-05-30: 1000 mL via INTRAVENOUS

## 2023-05-30 MED ORDER — ACETAMINOPHEN 325 MG PO TABS
650.0000 mg | ORAL_TABLET | Freq: Once | ORAL | Status: AC
  Administered 2023-05-30: 650 mg via ORAL
  Filled 2023-05-30: qty 2

## 2023-05-30 NOTE — Discharge Instructions (Signed)
You have signs of dehydration. You should cut down on your alcohol use but do not stop cold [place] as that is dangerous and cause withdrawal seizures. Your heart rates are elevated but at this time you have elected to want to leave the hospital you can return if you develop worsening symptoms or any other concerns

## 2023-05-30 NOTE — ED Triage Notes (Signed)
Patient BIB [facility] from the street c/o chest pain, sore throat, and bilateral leg pain.  Patient seen recently multiple times for same, patient admits to etoh use tonight.  EMS reports patient was combative with them,requiring 5 mg of versed.

## 2023-05-30 NOTE — ED Notes (Signed)
MD aware of critical ETOH 343.

## 2023-05-30 NOTE — ED Notes (Signed)
Urine sample sent to lab for hold

## 2023-05-30 NOTE — ED Provider Notes (Signed)
[facility] Provider Note    Event Date/Time   First MD Initiated Contact with Patient 05/30/23 [phone]     (approximate)   History   Alcohol Intoxication     HPI Angela Landry is a 42 y.o. female with a history of severe alcohol use disorder who is on her fourth ED visit in 6 months with multiple visits over the last couple of days.  She presents by EMS.  The call was for chest pain but she also said that her right leg hurts and she said that her throat is sore.  The symptoms have been going on for days.  She was reportedly aggressive and combative for EMS and they administered Versed 5 mg intramuscular so she is a bit altered but calm upon arrival to the emergency department.  It is unclear if she has been drinking but it is thought to be so.  She said that her chest is sore to the touch.  She is not short of breath.     Physical Exam   Triage Vital Signs: ED Triage Vitals  Encounter Vitals Group     BP 05/30/23 0607 (!) 148/102     Systolic BP Percentile --      Diastolic BP Percentile --      Pulse Rate 05/30/23 0607 (!) 126     Resp 05/30/23 0607 18     Temp 05/30/23 0607 (!) 97 F (36.1 C)     Temp Source 05/30/23 0607 Oral     SpO2 05/30/23 0607 93 %     Weight 05/30/23 0608 90 kg (198 lb 6.6 oz)     Height --      Head Circumference --      Peak Flow --      Pain Score 05/30/23 0608 4     Pain Loc --      Pain Education --      Exclude from Growth Chart --     Most recent vital signs: Vitals:   05/30/23 0607  BP: (!) 148/102  Pulse: (!) 126  Resp: 18  Temp: (!) 97 F (36.1 C)  SpO2: 93%    General: Awake, alert, disheveled, slurred speech, either due to alcohol or the prehospital Versed. CV:  Good peripheral perfusion.  Tachycardia in the 120s. Resp:  Normal effort. Speaking easily and comfortably, no accessory muscle usage nor intercostal retractions.   Abd:  No distention.  Other:  Moving all extremities.  No appreciable  swelling in the right lower extremity compared to the left.   ED Results / Procedures / Treatments   Labs (all labs ordered are listed, but only abnormal results are displayed) Labs Reviewed  CBC WITH DIFFERENTIAL/PLATELET  COMPREHENSIVE METABOLIC PANEL  LIPASE, BLOOD  ETHANOL  TROPONIN I (HIGH SENSITIVITY)     EKG  ED ECG REPORT I, Loleta Rose, the attending physician, personally viewed and interpreted this ECG.  Date: 05/30/2023 EKG Time: 6:42 AM Rate: 116 Rhythm: Sinus tachycardia QRS Axis: Right axis deviation Intervals: normal ST/T Wave abnormalities: Non-specific ST segment / T-wave changes, but no clear evidence of acute ischemia. Narrative Interpretation: no definitive evidence of acute ischemia; does not meet STEMI criteria.    PROCEDURES:  Critical Care performed: No  Procedures    IMPRESSION / MDM / ASSESSMENT AND PLAN / ED COURSE  I reviewed the triage vital signs and the nursing notes.  Differential diagnosis includes, but is not limited to, musculoskeletal strain, costochondritis, nonspecific chest wall pain, ACS, PE, GI associated pain (possible esophagitis given history of alcohol abuse), pancreatitis.  Patient's presentation is most consistent with acute presentation with potential threat to life or bodily function.  Labs/studies ordered: Troponin, EKG, CBC with differential, CMP, ethanol level, lipase  Interventions/Medications given:  Medications  sodium chloride 0.9 % bolus 1,000 mL (1,000 mLs Intravenous New Bag/Given 05/30/23 0648)    (Note:  hospital course my include additional interventions and/or labs/studies not listed above.)   Strongly suspect noncardiac chest pain.  The patient's chest pain is highly reproducible to any tenderness of the sternum or anterior chest wall in general.  Her vitals notable for heart rate in the 120s but this may be also situational and secondary to substances.  We will  reassess after she calms down and get settled in the emergency department.  Plan is to perform screening examination to rule out ACS.  Low suspicion for PE and if the patient's tachycardia resolves with rest and some fluid, that will be reassuring.  She was complaining of leg pain yesterday and an ultrasound was performed which shows no evidence of DVT and there is no physical signs or symptoms of swelling or redness.  I anticipate the patient will be discharged when clinically sober, particularly if/when her heart rate stabilizes.   Clinical Course as of 05/30/23 0700  Thu May 30, 2023  0454 Transferring ED care to Dr. Fuller Plan [CF]    Clinical Course User Index [CF] Loleta Rose, MD     FINAL CLINICAL IMPRESSION(S) / ED DIAGNOSES   Final diagnoses:  Atypical chest pain  Chest wall pain  Right leg pain  Alcohol abuse     Rx / DC Orders   ED Discharge Orders     None        Note:  This document was prepared using Dragon voice recognition software and may include unintentional dictation errors.   Loleta Rose, MD 05/30/23 0700

## 2023-05-30 NOTE — ED Notes (Signed)
This RN observed pt getting into uber at discharge. Pt alert and oriented at discharge. Gait steady. Respirations even and unlabored. Pt ambulatory without assist.

## 2023-05-30 NOTE — ED Provider Notes (Addendum)
7:42 AM Assumed care for off going team.   Blood pressure (!) 148/102, pulse (!) 126, temperature (!) 97 F (36.1 C), temperature source Oral, resp. rate 18, weight 90 kg, SpO2 93%.  See their HPI for full report but in brief pending labs   8:01 AM reevaluated patient.  Sitting up in bed and eating food.   EKG is sinus tachycardia rate of 125 without any ST elevation or T wave inversions, normal intervals  Cardiac markers were negative x 2.  Initial BMP showed some evidence of dehydration with low sodium low potassium low bicarb and elevated anion gap.  Doubt DKA.  Patient has been tolerating eating has no symptoms suggest this.  I suspect this is more likely related to alcohol intoxication and dehydration.  Patient be given 2 L of fluid and her BMP was rechecked and shows improvement.  12:51 PM re-evaluated pt and she is requesting dc home. She denies any SI. HR are still slightly elevated but hse reports she has not taken her daily metoprolol.  Explained to pt that she has signs of dehydration and encourged PO intake.  She is called herself an Benedetto Goad already and is able to  ambulate without issues.  Clinically sober and no indication for IVC at this time.        Concha Se, MD 05/30/23 1255    Concha Se, MD 05/30/23 2108

## 2023-05-30 NOTE — ED Notes (Signed)
Purple, light green, and dark green lab top sent to lab for hold.

## 2023-05-30 NOTE — ED Notes (Signed)
Sitter at bedside.  Patient requiring constant redirection from pulling at lines and attempting to get out of bed.  Patient has alarm on bed, non-skid socks, and fall risk bracelet on.

## 2023-05-30 NOTE — ED Notes (Signed)
Troponin ran off order from patient's previous visit.  Lab advised to redraw second trop at approx 0800.  Lab also advised they would locate patient's labs that were sent down and run them.

## 2023-06-02 ENCOUNTER — Encounter: Payer: Self-pay | Admitting: Emergency Medicine

## 2023-06-02 ENCOUNTER — Ambulatory Visit
Admission: EM | Admit: 2023-06-02 | Discharge: 2023-06-02 | Disposition: A | Discharge status: Home / Self Care | Payer: Medicaid Other | Attending: Physician Assistant | Admitting: Physician Assistant

## 2023-06-02 ENCOUNTER — Ambulatory Visit (INDEPENDENT_AMBULATORY_CARE_PROVIDER_SITE_OTHER): Payer: Medicaid Other

## 2023-06-02 DIAGNOSIS — J029 Acute pharyngitis, unspecified: Secondary | ICD-10-CM

## 2023-06-02 DIAGNOSIS — R490 Dysphonia: Secondary | ICD-10-CM | POA: Diagnosis present

## 2023-06-02 DIAGNOSIS — R062 Wheezing: Secondary | ICD-10-CM | POA: Diagnosis present

## 2023-06-02 DIAGNOSIS — R051 Acute cough: Secondary | ICD-10-CM

## 2023-06-02 DIAGNOSIS — J209 Acute bronchitis, unspecified: Secondary | ICD-10-CM | POA: Diagnosis present

## 2023-06-02 LAB — GROUP A STREP BY PCR: Group A Strep by PCR: NOT DETECTED

## 2023-06-02 MED ORDER — PREDNISONE 20 MG PO TABS: 40.0000 mg | ORAL_TABLET | Freq: Every day | ORAL | 0 refills | Status: AC

## 2023-06-02 MED ORDER — ALBUTEROL SULFATE HFA 108 (90 BASE) MCG/ACT IN AERS: 1.0000 | INHALATION_SPRAY | Freq: Four times a day (QID) | RESPIRATORY_TRACT | 0 refills | Status: DC | PRN

## 2023-06-02 MED ORDER — PROMETHAZINE-DM 6.25-15 MG/5ML PO SYRP: 5.0000 mL | ORAL_SOLUTION | Freq: Four times a day (QID) | ORAL | 0 refills | Status: DC | PRN

## 2023-06-02 MED ORDER — ALBUTEROL SULFATE (2.5 MG/3ML) 0.083% IN NEBU
2.5000 mg | INHALATION_SOLUTION | Freq: Once | RESPIRATORY_TRACT | Status: AC
  Administered 2023-06-02: 2.5 mg via RESPIRATORY_TRACT

## 2023-06-02 MED ORDER — LIDOCAINE VISCOUS HCL 2 % MT SOLN
15.0000 mL | OROMUCOSAL | 0 refills | Status: DC | PRN
Start: 2023-06-02 — End: 2023-06-19

## 2023-06-02 NOTE — ED Triage Notes (Signed)
Patient c/o sore throat, cough, and chest congestion that started on Thursday.  Patient was seen in the ED for these same symptoms.  Patient reports that she has lost her voice. Patient denies fevers.

## 2023-06-02 NOTE — Discharge Instructions (Addendum)
-  Your strep test was negative again today. - The chest x-ray does not show pneumonia. - Since your cough is productive and may have a bit of wheezing this appears to be consistent with bronchitis.  Bronchitis is generally viral and can last for a few weeks.  I sent prednisone to the pharmacy to help with the wheezing.  I also sent cough medicine, viscous lidocaine and an inhaler. - Increase rest and fluids. - Return if you develop fever, the voice hoarseness does not resolve after 2 weeks, you have increased shortness of breath or weakness.

## 2023-06-02 NOTE — ED Provider Notes (Signed)
[facility]    CSN: [id number] Arrival date & time: 06/02/23  0855      History   Chief Complaint Chief Complaint  Patient presents with   Sore Throat   Cough    HPI Angela Landry is a 42 y.o. female.   Patient reports 5 day history of illness. Reporting fatigue, productive cough, congestion, sore throat, voice hoarseness, shortness of breath, post tussive vomiting, and chest pain.   Has been trying Robitussin and home remedies without relief.   Seen in ED 12/17 and 12/19.  On 12/17 patient had significant workup including respiratory panel, strep, CBC, troponin x 1, CMP, ethanol, lactic acid, urinalysis, and a chest x-ray.  At that visit she was diagnosed with sore throat, runny nose and alcohol intoxication.  She returned 2 days later for chest pain.  At the ED visit on 12/19 patient had troponin x 3, BMP, CBG, ethanol, lipase, CBC and CMP testing.  Also had a ultrasound of her right leg for DVT.  Diagnosed at that time with atypical chest pain without any significant findings and sent home.  Patient is medical history significant for thyroid disease, diabetes, fibromyalgia, IBS and alcohol use disorder.  HPI  Past Medical History:  Diagnosis Date   Diabetes mellitus without complication (HCC)    Fibromyalgia    GERD (gastroesophageal reflux disease)    IBS (irritable bowel syndrome)    Thyroid disease     There are no active problems to display for this patient.   History reviewed. No pertinent surgical history.  OB History   No obstetric history on file.      Home Medications    Prior to Admission medications   Medication Sig Start Date End Date Taking? Authorizing Provider  albuterol (VENTOLIN HFA) 108 (90 Base) MCG/ACT inhaler Inhale 1-2 puffs into the lungs every 6 (six) hours as needed for wheezing or shortness of breath. 06/02/23  Yes Eusebio Friendly B, PA-C  ACCU-CHEK GUIDE test strip USE 1-4 TIMES DAILY AS DIRECTED BY PHYSICIAN TO  MONITOR BLOOD SUGARS 10/10/20   [provider]  ARMOUR THYROID 120 MG tablet Take 120 mg by mouth every morning. 10/10/20   [provider]  ASPIRIN LOW DOSE 81 MG EC tablet Take 81 mg by mouth daily. 10/10/20   [provider]  DULoxetine (CYMBALTA) 60 MG capsule Take 60 mg by mouth daily. 10/10/20   [provider]  gabapentin (NEURONTIN) 300 MG capsule Take 3 capsules by mouth daily. 08/08/20   [provider]  ipratropium (ATROVENT) 0.06 % nasal spray Place 2 sprays into both nostrils 4 (four) times daily. 04/02/23   Domenick Gong, MD  levonorgestrel (MIRENA) 20 MCG/DAY IUD by Intrauterine route.    [provider]  lidocaine (XYLOCAINE) 2 % solution Use as directed 15 mLs in the mouth or throat every 3 (three) hours as needed for mouth pain (swish and spit). 06/02/23  Yes Shirlee Latch, PA-C  linaclotide Karlene Einstein) 290 MCG CAPS capsule  12/08/15   [provider]  lisinopril (ZESTRIL) 10 MG tablet Take 1 tablet by mouth daily. 03/20/23 03/19/24  [provider]  metFORMIN (GLUCOPHAGE) 500 MG tablet Take by mouth. 12/22/21 03/19/24  [provider]  naproxen sodium (ANAPROX DS) 550 MG tablet Take 1 tablet (550 mg total) by mouth 2 (two) times daily with a meal. 10/15/21   White, Elita Boone, NP  norethindrone (MICRONOR) 0.35 MG tablet Take 1 tablet by mouth daily. 02/08/23 02/08/24  [provider]  OZEMPIC, 0.25 OR 0.5 MG/DOSE, 2 MG/1.5ML SOPN Inject 0.5 mg into the skin once a week. 10/10/20   [provider]  pantoprazole (PROTONIX) 40 MG tablet Take 1 tablet by mouth daily. 10/10/20   [provider]  phenol (CHLORASEPTIC GARGLE) 1.4 % LIQD Use as directed 1 spray in the mouth or throat as needed for throat irritation / pain. 05/28/23   Merwyn Katos, MD  predniSONE (DELTASONE) 20 MG tablet Take 2 tablets (40 mg total) by mouth daily for 5 days. 06/02/23 06/07/23 Yes Shirlee Latch, PA-C   promethazine (PHENERGAN) 25 MG tablet Take 25 mg by mouth every 6 (six) hours as needed. 09/30/20   [provider]  promethazine-dextromethorphan (PROMETHAZINE-DM) 6.25-15 MG/5ML syrup Take 5 mLs by mouth 4 (four) times daily as needed. 06/02/23  Yes Shirlee Latch, PA-C  Spacer/Aero-Holding Chambers (AEROCHAMBER MV) inhaler Use as instructed 04/02/23   Domenick Gong, MD  tranexamic acid (LYSTEDA) 650 MG TABS tablet Take by mouth. 02/08/23   [provider]  traZODone (DESYREL) 50 MG tablet TAKE 1/2 TO 1 TABLET BY MOUTH EVERY NIGHT 1 HOUR BEFORE BEDTIME 12/09/20   [provider]    Family History History reviewed. No pertinent family history.  Social History Social History   Tobacco Use   Smoking status: Every Day    Types: Cigarettes   Smokeless tobacco: Never  Vaping Use   Vaping status: Never Used  Substance Use Topics   Alcohol use: Yes   Drug use: Never     Allergies   Patient has no known allergies.   Review of Systems Review of Systems  Constitutional:  Positive for fatigue and fever. Negative for chills and diaphoresis.  HENT:  Positive for congestion, rhinorrhea, sore throat and voice change. Negative for ear pain, sinus pressure and sinus pain.   Respiratory:  Positive for cough, shortness of breath and wheezing.   Cardiovascular:  Positive for chest pain. Negative for palpitations.  Gastrointestinal:  Positive for nausea and vomiting. Negative for abdominal pain.  Musculoskeletal:  Positive for myalgias.  Skin:  Negative for rash.  Neurological:  Negative for weakness and headaches.  Hematological:  Negative for adenopathy.     Physical Exam Triage Vital Signs ED Triage Vitals  Encounter Vitals Group     BP      Systolic BP Percentile      Diastolic BP Percentile      Pulse      Resp      Temp      Temp src      SpO2      Weight      Height      Head Circumference      Peak Flow      Pain Score      Pain Loc       Pain Education      Exclude from Growth Chart    No data found.  Updated Vital Signs BP (!) 145/90 (BP Location: Left Arm)   Pulse 91   Temp 98.7 F (37.1 C) (Oral)   Resp 20   Ht 5\' 4"  (1.626 m)   Wt 198 lb 6.6 oz (90 kg)   SpO2 99%   BMI 34.06 kg/m   Physical Exam Vitals and nursing note reviewed.  Constitutional:      General: She is not in acute distress.    Appearance: Normal appearance. She is ill-appearing. She is not toxic-appearing.  Comments: +voice hoarseness  HENT:     Head: Normocephalic and atraumatic.     Nose: Nose normal.     Mouth/Throat:     Mouth: Mucous membranes are moist.     Pharynx: Oropharynx is clear. Posterior oropharyngeal erythema present.  Eyes:     General: No scleral icterus.       Right eye: No discharge.        Left eye: No discharge.     Conjunctiva/sclera: Conjunctivae normal.  Cardiovascular:     Rate and Rhythm: Normal rate and regular rhythm.     Heart sounds: Normal heart sounds.  Pulmonary:     Effort: Pulmonary effort is normal. No respiratory distress.     Breath sounds: Examination of the left-lower field reveals wheezing. Wheezing present.  Musculoskeletal:     Cervical back: Neck supple.  Skin:    General: Skin is dry.  Neurological:     General: No focal deficit present.     Mental Status: She is alert. Mental status is at baseline.     Motor: No weakness.     Gait: Gait normal.  Psychiatric:        Mood and Affect: Mood normal.        Behavior: Behavior normal.      UC Treatments / Results  Labs (all labs ordered are listed, but only abnormal results are displayed) Labs Reviewed  GROUP A STREP BY PCR    EKG   Radiology DG Chest 2 View Result Date: 06/02/2023 CLINICAL DATA:  Cough, congestion and shortness of breath. EXAM: CHEST - 2 VIEW COMPARISON:  05/28/2023 FINDINGS: Heart size and mediastinal contours appear normal. No pleural fluid, interstitial edema or airspace disease. The visualized  osseous structures appear intact. IMPRESSION: No acute cardiopulmonary disease. Electronically Signed   By: Signa Kell M.D.   On: 06/02/2023 10:06    Procedures Procedures (including critical care time)  Medications Ordered in UC Medications  albuterol (PROVENTIL) (2.5 MG/3ML) 0.083% nebulizer solution 2.5 mg (2.5 mg Nebulization Given 06/02/23 0950)    Initial Impression / Assessment and Plan / UC Course  I have reviewed the triage vital signs and the nursing notes.  Pertinent labs & imaging results that were available during my care of the patient were reviewed by me and considered in my medical decision making (see chart for details).   42 year old female presents for 5-day history of illness.  Reports fatigue, productive cough, congestion, sore throat, voice hoarseness, posttussive vomiting, shortness of breath and chest pain.  Blood pressure 145/90.  She is afebrile.  Overall well-appearing but very hoarse voice.  Mild erythema posterior pharynx.  Mild nasal congestion.  Wheezes of left lower lung base.  Heart regular rate and rhythm.  Will obtain strep and chest x-ray today.   Reviewed ED visit notes, labs and imaging results from 12/17-18 and 12/19 of this year.  Seen in ED 12/17 and 12/19.  On 12/17 patient had significant workup including respiratory panel, strep, CBC, troponin x 1, CMP, ethanol, lactic acid, urinalysis, and a chest x-ray.  At that visit she was diagnosed with sore throat, runny nose and alcohol intoxication.  She returned 2 days later for chest pain.  At the ED visit on 12/19 patient had troponin x 3, BMP, CBG, ethanol, lipase, CBC and CMP testing.  Also had a ultrasound of her right leg for DVT.  Diagnosed at that time with atypical chest pain without any significant findings and sent home.  Will  give patient albuterol neb treatment.   Negative strep testing.  Chest x-ray interpreted by me to be negative.  Compared this to chest x-ray from a few days  ago.  Over read chest x-ray is negative.  Patient has already had a full cardiac workup in ED.  No suspicion for cardiac abnormality causing her symptoms.  Explained to her that her symptoms are consistent with acute viral bronchitis.  Supportive care was encouraged with increasing rest and fluids.  I sent prednisone and ProAir inhaler to pharmacy to help with wheezing.  Additionally sent Promethazine DM for cough and viscous lidocaine for throat pain.  Reviewed that bronchitis can last for few weeks.  Advised her to return or go to ED if she has fever, worsening cough, chest pain that is worsening, increased shortness of breath or weakness.  She is agreeable to plan.  Acute illness with systemic symptoms.  Also reviewed numerous tests and 2 previous ED visits.   Final Clinical Impressions(s) / UC Diagnoses   Final diagnoses:  Wheezing  Acute bronchitis, unspecified organism  Acute cough  Sore throat  Voice hoarseness     Discharge Instructions      -Your strep test was negative again today. - The chest x-ray does not show pneumonia. - Since your cough is productive and may have a bit of wheezing this appears to be consistent with bronchitis.  Bronchitis is generally viral and can last for a few weeks.  I sent prednisone to the pharmacy to help with the wheezing.  I also sent cough medicine, viscous lidocaine and an inhaler. - Increase rest and fluids. - Return if you develop fever, the voice hoarseness does not resolve after 2 weeks, you have increased shortness of breath or weakness.     ED Prescriptions     Medication Sig Dispense Auth. Provider   promethazine-dextromethorphan (PROMETHAZINE-DM) 6.25-15 MG/5ML syrup Take 5 mLs by mouth 4 (four) times daily as needed. 118 mL Eusebio Friendly B, PA-C   predniSONE (DELTASONE) 20 MG tablet Take 2 tablets (40 mg total) by mouth daily for 5 days. 10 tablet Eusebio Friendly B, PA-C   lidocaine (XYLOCAINE) 2 % solution Use as directed 15 mLs  in the mouth or throat every 3 (three) hours as needed for mouth pain (swish and spit). 100 mL Eusebio Friendly B, PA-C   albuterol (VENTOLIN HFA) 108 (90 Base) MCG/ACT inhaler Inhale 1-2 puffs into the lungs every 6 (six) hours as needed for wheezing or shortness of breath. 1 g Shirlee Latch, PA-C      PDMP not reviewed this encounter.   Shirlee Latch, PA-C 06/02/23 1028

## 2023-06-08 ENCOUNTER — Telehealth: Payer: Medicaid Other | Admitting: Nurse Practitioner

## 2023-06-08 DIAGNOSIS — J060 Acute laryngopharyngitis: Secondary | ICD-10-CM | POA: Diagnosis not present

## 2023-06-08 NOTE — Patient Instructions (Signed)
Angela Landry, thank you for joining [name], NP for today's virtual visit.  While this provider is not your primary care provider (PCP), if your PCP is located in our provider database this encounter information will be shared with them immediately following your visit.   A [place] MyChart account gives you access to today's visit and all your visits, tests, and labs performed at [facility] " click here if you don't have a [facility] MyChart account or go to [email or web address]  Consent: (Patient) Angela Landry provided verbal consent for this virtual visit at the beginning of the encounter.  Current Medications:  Current Outpatient Medications:    ACCU-CHEK GUIDE test strip, USE 1-4 TIMES DAILY AS DIRECTED BY PHYSICIAN TO MONITOR BLOOD SUGARS, Disp: , Rfl:    albuterol (VENTOLIN HFA) 108 (90 Base) MCG/ACT inhaler, Inhale 1-2 puffs into the lungs every 6 (six) hours as needed for wheezing or shortness of breath., Disp: 1 g, Rfl: 0   ARMOUR THYROID 120 MG tablet, Take 120 mg by mouth every morning., Disp: , Rfl:    ASPIRIN LOW DOSE 81 MG EC tablet, Take 81 mg by mouth daily., Disp: , Rfl:    DULoxetine (CYMBALTA) 60 MG capsule, Take 60 mg by mouth daily., Disp: , Rfl:    gabapentin (NEURONTIN) 300 MG capsule, Take 3 capsules by mouth daily., Disp: , Rfl:    ipratropium (ATROVENT) 0.06 % nasal spray, Place 2 sprays into both nostrils 4 (four) times daily., Disp: 15 mL, Rfl: 0   levonorgestrel (MIRENA) 20 MCG/DAY IUD, by Intrauterine route., Disp: , Rfl:    lidocaine (XYLOCAINE) 2 % solution, Use as directed 15 mLs in the mouth or throat every 3 (three) hours as needed for mouth pain (swish and spit)., Disp: 100 mL, Rfl: 0   linaclotide (LINZESS) 290 MCG CAPS capsule, , Disp: , Rfl:    lisinopril (ZESTRIL) 10 MG tablet, Take 1 tablet by mouth daily., Disp: , Rfl:    metFORMIN (GLUCOPHAGE) 500 MG tablet, Take by mouth., Disp: , Rfl:    naproxen sodium  (ANAPROX DS) 550 MG tablet, Take 1 tablet (550 mg total) by mouth 2 (two) times daily with a meal., Disp: 30 tablet, Rfl: 0   norethindrone (MICRONOR) 0.35 MG tablet, Take 1 tablet by mouth daily., Disp: , Rfl:    OZEMPIC, 0.25 OR 0.5 MG/DOSE, 2 MG/1.5ML SOPN, Inject 0.5 mg into the skin once a week., Disp: , Rfl:    pantoprazole (PROTONIX) 40 MG tablet, Take 1 tablet by mouth daily., Disp: , Rfl:    phenol (CHLORASEPTIC GARGLE) 1.4 % LIQD, Use as directed 1 spray in the mouth or throat as needed for throat irritation / pain., Disp: 20 mL, Rfl: 0   promethazine (PHENERGAN) 25 MG tablet, Take 25 mg by mouth every 6 (six) hours as needed., Disp: , Rfl:    promethazine-dextromethorphan (PROMETHAZINE-DM) 6.25-15 MG/5ML syrup, Take 5 mLs by mouth 4 (four) times daily as needed., Disp: 118 mL, Rfl: 0   Spacer/Aero-Holding Chambers (AEROCHAMBER MV) inhaler, Use as instructed, Disp: 1 each, Rfl: 1   tranexamic acid (LYSTEDA) 650 MG TABS tablet, Take by mouth., Disp: , Rfl:    traZODone (DESYREL) 50 MG tablet, TAKE 1/2 TO 1 TABLET BY MOUTH EVERY NIGHT 1 HOUR BEFORE BEDTIME, Disp: , Rfl:    Medications ordered in this encounter:  No orders of the defined types were placed in this encounter.    *If you need refills on other medications prior  to your next appointment, please contact your pharmacy*  Follow-Up: Call back or seek an in-person evaluation if the symptoms worsen or if the condition fails to improve as anticipated.  [facility] [phone]  Other Instructions Return to Urgent care or ED today due to increased inflammation of larynx. Also has history of ETOH abuse and malignancy should be ruled out She states she is taking omeprazole for GERD daily   If you have been instructed to have an in-person evaluation today at a local Urgent Care facility, please use the link below. It will take you to a list of all of our available [facility], including address, phone  number and hours of operation. Please do not delay care.  [facility]  If you or a family member do not have a primary care provider, use the link below to schedule a visit and establish care. When you choose a [place] primary care physician or advanced practice provider, you gain a long-term partner in health. Find a Primary Care Provider  Learn more about [facility]'s in-office and virtual care options: [place] [facility] - Get Care Now

## 2023-06-08 NOTE — Progress Notes (Signed)
Virtual Visit Consent   Angela Landry, you are scheduled for a virtual visit with a [facility] provider today. Just as with appointments in the office, your consent must be obtained to participate. Your consent will be active for this visit and any virtual visit you may have with one of our providers in the next 365 days. If you have a MyChart account, a copy of this consent can be sent to you electronically.  As this is a virtual visit, video technology does not allow for your provider to perform a traditional examination. This may limit your provider's ability to fully assess your condition. If your provider identifies any concerns that need to be evaluated in person or the need to arrange testing (such as labs, EKG, etc.), we will make arrangements to do so. Although advances in technology are sophisticated, we cannot ensure that it will always work on either your end or our end. If the connection with a video visit is poor, the visit may have to be switched to a telephone visit. With either a video or telephone visit, we are not always able to ensure that we have a secure connection.  By engaging in this virtual visit, you consent to the provision of healthcare and authorize for your insurance to be billed (if applicable) for the services provided during this visit. Depending on your insurance coverage, you may receive a charge related to this service.  I need to obtain your verbal consent now. Are you willing to proceed with your visit today? Angela Landry has provided verbal consent on 06/08/2023 for a virtual visit (video or telephone). [name], NP  Date: 06/08/2023 2:58 PM  Virtual Visit via Video Note   I, [name], connected with  Angela Landry  ([id number], 08/07/1980) on 06/08/23 at  2:45 PM EST by a video-enabled telemedicine application and verified that I am speaking with the correct person using two identifiers.  Location: Patient: Virtual Visit Location  Patient: Home Provider: Virtual Visit Location Provider: Home Office   I discussed the limitations of evaluation and management by telemedicine and the availability of in person appointments. The patient expressed understanding and agreed to proceed.    History of Present Illness: Angela Landry is a 42 y.o. who identifies as a female who was assigned female at birth, and is being seen today for worsening laryngitis.  Angela Landry was treated 12-22 for sore throat and cough with lidocaine solution, prednisone and inhaler. At that time she had complaints of fatigue, productive cough, congestion, sore throat, voice hoarseness, shortness of breath, post tussive vomiting, and chest pain. Strep PCR negative for strep A. Today she states her laryngitis has worsened and her entire neck feels swollen. She is also experiencing shortness of breath.    Problems: There are no active problems to display for this patient.   Allergies: No Known Allergies Medications:  Current Outpatient Medications:    ACCU-CHEK GUIDE test strip, USE 1-4 TIMES DAILY AS DIRECTED BY PHYSICIAN TO MONITOR BLOOD SUGARS, Disp: , Rfl:    albuterol (VENTOLIN HFA) 108 (90 Base) MCG/ACT inhaler, Inhale 1-2 puffs into the lungs every 6 (six) hours as needed for wheezing or shortness of breath., Disp: 1 g, Rfl: 0   ARMOUR THYROID 120 MG tablet, Take 120 mg by mouth every morning., Disp: , Rfl:    ASPIRIN LOW DOSE 81 MG EC tablet, Take 81 mg by mouth daily., Disp: , Rfl:    DULoxetine (CYMBALTA) 60 MG capsule, Take 60  mg by mouth daily., Disp: , Rfl:    gabapentin (NEURONTIN) 300 MG capsule, Take 3 capsules by mouth daily., Disp: , Rfl:    ipratropium (ATROVENT) 0.06 % nasal spray, Place 2 sprays into both nostrils 4 (four) times daily., Disp: 15 mL, Rfl: 0   levonorgestrel (MIRENA) 20 MCG/DAY IUD, by Intrauterine route., Disp: , Rfl:    lidocaine (XYLOCAINE) 2 % solution, Use as directed 15 mLs in the mouth or throat every 3 (three)  hours as needed for mouth pain (swish and spit)., Disp: 100 mL, Rfl: 0   linaclotide (LINZESS) 290 MCG CAPS capsule, , Disp: , Rfl:    lisinopril (ZESTRIL) 10 MG tablet, Take 1 tablet by mouth daily., Disp: , Rfl:    metFORMIN (GLUCOPHAGE) 500 MG tablet, Take by mouth., Disp: , Rfl:    naproxen sodium (ANAPROX DS) 550 MG tablet, Take 1 tablet (550 mg total) by mouth 2 (two) times daily with a meal., Disp: 30 tablet, Rfl: 0   norethindrone (MICRONOR) 0.35 MG tablet, Take 1 tablet by mouth daily., Disp: , Rfl:    OZEMPIC, 0.25 OR 0.5 MG/DOSE, 2 MG/1.5ML SOPN, Inject 0.5 mg into the skin once a week., Disp: , Rfl:    pantoprazole (PROTONIX) 40 MG tablet, Take 1 tablet by mouth daily., Disp: , Rfl:    phenol (CHLORASEPTIC GARGLE) 1.4 % LIQD, Use as directed 1 spray in the mouth or throat as needed for throat irritation / pain., Disp: 20 mL, Rfl: 0   promethazine (PHENERGAN) 25 MG tablet, Take 25 mg by mouth every 6 (six) hours as needed., Disp: , Rfl:    promethazine-dextromethorphan (PROMETHAZINE-DM) 6.25-15 MG/5ML syrup, Take 5 mLs by mouth 4 (four) times daily as needed., Disp: 118 mL, Rfl: 0   Spacer/Aero-Holding Chambers (AEROCHAMBER MV) inhaler, Use as instructed, Disp: 1 each, Rfl: 1   tranexamic acid (LYSTEDA) 650 MG TABS tablet, Take by mouth., Disp: , Rfl:    traZODone (DESYREL) 50 MG tablet, TAKE 1/2 TO 1 TABLET BY MOUTH EVERY NIGHT 1 HOUR BEFORE BEDTIME, Disp: , Rfl:   Observations/Objective: Patient is well-developed, well-nourished in no acute distress.  Resting comfortably at home.  Head is normocephalic, atraumatic.  No labored breathing.  Speech is high pitched with patient requiring great effort to answer questions noted  Patient is alert and oriented at baseline.    Assessment and Plan: 1. Sore throat and laryngitis (Primary) Return to Urgent care or ED today due to increased inflammation of larynx. Also has history of ETOH abuse and malignancy should be ruled out She  states she is taking omeprazole for GERD daily  Follow Up Instructions: I discussed the assessment and treatment plan with the patient. The patient was provided an opportunity to ask questions and all were answered. The patient agreed with the plan and demonstrated an understanding of the instructions.  A copy of instructions were sent to the patient via MyChart unless otherwise noted below.    The patient was advised to call back or seek an in-person evaluation if the symptoms worsen or if the condition fails to improve as anticipated.    [name], NP

## 2023-06-09 ENCOUNTER — Ambulatory Visit
Admission: EM | Admit: 2023-06-09 | Discharge: 2023-06-09 | Disposition: A | Discharge status: Home / Self Care | Payer: Medicaid Other | Attending: Family Medicine | Admitting: Family Medicine

## 2023-06-09 ENCOUNTER — Ambulatory Visit (INDEPENDENT_AMBULATORY_CARE_PROVIDER_SITE_OTHER): Payer: Medicaid Other

## 2023-06-09 ENCOUNTER — Encounter: Payer: Self-pay | Admitting: Emergency Medicine

## 2023-06-09 DIAGNOSIS — E1165 Type 2 diabetes mellitus with hyperglycemia: Secondary | ICD-10-CM | POA: Diagnosis not present

## 2023-06-09 DIAGNOSIS — R0602 Shortness of breath: Secondary | ICD-10-CM

## 2023-06-09 DIAGNOSIS — R490 Dysphonia: Secondary | ICD-10-CM | POA: Diagnosis not present

## 2023-06-09 LAB — GLUCOSE, CAPILLARY: Glucose-Capillary: 223 mg/dL — ABNORMAL HIGH (ref 70–99)

## 2023-06-09 MED ORDER — AZITHROMYCIN 250 MG PO TABS: ORAL_TABLET | ORAL | 0 refills | Status: DC

## 2023-06-09 MED ORDER — PROMETHAZINE-DM 6.25-15 MG/5ML PO SYRP: 5.0000 mL | ORAL_SOLUTION | Freq: Four times a day (QID) | ORAL | 0 refills | Status: DC | PRN

## 2023-06-09 MED ORDER — PREDNISONE 10 MG (21) PO TBPK
ORAL_TABLET | Freq: Every day | ORAL | 0 refills | Status: DC
Start: 2023-06-09 — End: 2023-07-24

## 2023-06-09 NOTE — ED Provider Notes (Addendum)
[facility]    CSN: [id number] Arrival date & time: 06/09/23  7829      History   Chief Complaint Chief Complaint  Patient presents with   Hoarse   Shortness of Breath    HPI Angela Landry is a 42 y.o. female.   HPI  History obtained from the patient. Angela Landry presents for hoarseness, shortness of breath, cough, body aches for the past 3 weeks.  Feels like her symptoms aren't improving. Used cough drop, prescription cough syrup, Mucinex, albuterol inhaler and hot tea. She had a fever last week.  Endorses headache and chest congestion.  No nasal congestion. Takes Cymbalta for fibromyalgia.   She smokes cigarettes.  No history of asthma or COPD. Had quit for 5 months and then slipped up 2 weeks ago.  Hasn't drank in a few days.         Past Medical History:  Diagnosis Date   Diabetes mellitus without complication (HCC)    Fibromyalgia    GERD (gastroesophageal reflux disease)    IBS (irritable bowel syndrome)    Thyroid disease     There are no active problems to display for this patient.   History reviewed. No pertinent surgical history.  OB History   No obstetric history on file.      Home Medications    Prior to Admission medications   Medication Sig Start Date End Date Taking? Authorizing Provider  azithromycin (ZITHROMAX Z-PAK) 250 MG tablet Take 2 tablets on day 1 then 1 tablet daily 06/09/23  Yes Rosabell Geyer, DO  predniSONE (STERAPRED UNI-PAK 21 TAB) 10 MG (21) TBPK tablet Take by mouth daily. Take 6 tabs by mouth daily for 1, then 5 tabs for 1 day, then 4 tabs for 1 day, then 3 tabs for 1 day, then 2 tabs for 1 day, then 1 tab for 1 day. 06/09/23  Yes Ardeth Repetto, DO  ACCU-CHEK GUIDE test strip USE 1-4 TIMES DAILY AS DIRECTED BY PHYSICIAN TO MONITOR BLOOD SUGARS 10/10/20   [provider]  albuterol (VENTOLIN HFA) 108 (90 Base) MCG/ACT inhaler Inhale 1-2 puffs into the lungs every 6 (six) hours as needed for wheezing or  shortness of breath. 06/02/23   Shirlee Latch, PA-C  ARMOUR THYROID 120 MG tablet Take 120 mg by mouth every morning. 10/10/20   [provider]  ASPIRIN LOW DOSE 81 MG EC tablet Take 81 mg by mouth daily. 10/10/20   [provider]  DULoxetine (CYMBALTA) 60 MG capsule Take 60 mg by mouth daily. 10/10/20   [provider]  gabapentin (NEURONTIN) 300 MG capsule Take 3 capsules by mouth daily. 08/08/20   [provider]  ipratropium (ATROVENT) 0.06 % nasal spray Place 2 sprays into both nostrils 4 (four) times daily. 04/02/23   Domenick Gong, MD  levonorgestrel (MIRENA) 20 MCG/DAY IUD by Intrauterine route.    [provider]  lidocaine (XYLOCAINE) 2 % solution Use as directed 15 mLs in the mouth or throat every 3 (three) hours as needed for mouth pain (swish and spit). 06/02/23   Shirlee Latch, PA-C  linaclotide Karlene Einstein) 290 MCG CAPS capsule  12/08/15   [provider]  lisinopril (ZESTRIL) 10 MG tablet Take 1 tablet by mouth daily. 03/20/23 03/19/24  [provider]  metFORMIN (GLUCOPHAGE) 500 MG tablet Take by mouth. 12/22/21 03/19/24  [provider]  naproxen sodium (ANAPROX DS) 550 MG tablet Take 1 tablet (550 mg total) by mouth 2 (two) times daily  with a meal. 10/15/21   White, Elita Boone, NP  norethindrone (MICRONOR) 0.35 MG tablet Take 1 tablet by mouth daily. 02/08/23 02/08/24  [provider]  OZEMPIC, 0.25 OR 0.5 MG/DOSE, 2 MG/1.5ML SOPN Inject 0.5 mg into the skin once a week. 10/10/20   [provider]  pantoprazole (PROTONIX) 40 MG tablet Take 1 tablet by mouth daily. 10/10/20   [provider]  phenol (CHLORASEPTIC GARGLE) 1.4 % LIQD Use as directed 1 spray in the mouth or throat as needed for throat irritation / pain. 05/28/23   Merwyn Katos, MD  promethazine-dextromethorphan (PROMETHAZINE-DM) 6.25-15 MG/5ML syrup Take 5 mLs by mouth 4 (four) times daily as needed. 06/09/23   Katha Cabal, DO   Spacer/Aero-Holding Chambers (AEROCHAMBER MV) inhaler Use as instructed 04/02/23   Domenick Gong, MD  tranexamic acid (LYSTEDA) 650 MG TABS tablet Take by mouth. 02/08/23   [provider]  traZODone (DESYREL) 50 MG tablet TAKE 1/2 TO 1 TABLET BY MOUTH EVERY NIGHT 1 HOUR BEFORE BEDTIME 12/09/20   [provider]    Family History History reviewed. No pertinent family history.  Social History Social History   Tobacco Use   Smoking status: Every Day    Types: Cigarettes   Smokeless tobacco: Never  Vaping Use   Vaping status: Never Used  Substance Use Topics   Alcohol use: Yes   Drug use: Never     Allergies   Patient has no known allergies.   Review of Systems Review of Systems: negative unless otherwise stated in HPI.      Physical Exam Triage Vital Signs ED Triage Vitals  Encounter Vitals Group     BP      Systolic BP Percentile      Diastolic BP Percentile      Pulse      Resp      Temp      Temp src      SpO2      Weight      Height      Head Circumference      Peak Flow      Pain Score      Pain Loc      Pain Education      Exclude from Growth Chart    No data found.  Updated Vital Signs BP 128/77 (BP Location: Left Arm)   Pulse 95   Temp 98.9 F (37.2 C) (Oral)   SpO2 97%   Visual Acuity Right Eye Distance:   Left Eye Distance:   Bilateral Distance:    Right Eye Near:   Left Eye Near:    Bilateral Near:     Physical Exam GEN:     alert, non-toxic appearing female, tearful   HENT:  mucus membranes moist, no nasal discharge, hoarse voice EYES:   no scleral injection or discharge RESP:  no increased work of breathing, clear to auscultation bilaterally CVS:   regular rate and rhythm Skin:   warm and dry    UC Treatments / Results  Labs (all labs ordered are listed, but only abnormal results are displayed) Labs Reviewed  GLUCOSE, CAPILLARY - Abnormal; Notable for the following components:      Result Value    Glucose-Capillary 223 (*)    All other components within normal limits  CBG MONITORING, ED    EKG   Radiology DG Chest 2 View Result Date: 06/09/2023 CLINICAL DATA:  Shortness of breath. EXAM: CHEST - 2 VIEW COMPARISON:  06/02/2023. FINDINGS: Bilateral lung fields are clear. Bilateral costophrenic angles are clear. Normal cardio-mediastinal silhouette. No acute osseous abnormalities. The soft tissues are within normal limits. IMPRESSION: No active cardiopulmonary disease. Electronically Signed   By: Jules Schick M.D.   On: 06/09/2023 09:17    Procedures Procedures (including critical care time)  Medications Ordered in UC Medications - No data to display  Initial Impression / Assessment and Plan / UC Course  I have reviewed the triage vital signs and the nursing notes.  Pertinent labs & imaging results that were available during my care of the patient were reviewed by me and considered in my medical decision making (see chart for details).      Pt is a 42 y.o. female who presents for 3 weeks of cough and hoarseness that is not improving.  Angela Landry is  afebrile here without recent antipyretics. Satting well on room air. Overall pt is  non-toxic appearing, well hydrated, without respiratory distress. Pulmonary exam is unremarkable  except for cough.  After shared decision making, we will pursue chest x-ray.  COVID  and influenza testing deferred due to length of symptoms.  EKG was obtained as she is complaining of shortness of breath.  EKG personally reviewed by me showing normal sinus rhythm without acute ST or T wave changes.  No QT prolongation.  ST abnormality has resolved compared to 06/03/2023 EKG.  Chest xray personally reviewed by me without focal pneumonia, pleural effusion, cardiomegaly or pneumothorax.  Radiologist impression reviewed.  Treat acute bronchitis with steroids and antibiotics as below.  Promethazine DM cough syrup given for cough and allow patient to rest.   Typical duration of symptoms discussed.  Continue using albuterol inhaler as needed.  Type 2 diabetes: CBG here elevated at 223.  Patient has not been taking her metformin regularly.  Advised patient to keep a log of her blood sugars and take to her PCP.  Hoarseness: Going 3 weeks now.  She is a smoker so I am concerned that there may be a cancerous growth on her vocal cords.  Advised patient to keep this appointment with ENT on 07/08/2023 with Dr. Gillermina Hu at [facility].  Return and ED precautions given and patient voiced understanding. Discussed MDM, treatment plan and plan for follow-up with patient who agrees with plan.      Final Clinical Impressions(s) / UC Diagnoses   Final diagnoses:  SOB (shortness of breath)  Type 2 diabetes mellitus with hyperglycemia, without long-term current use of insulin (HCC)  Hoarseness of voice     Discharge Instructions      Be sure to keep your appointment with your ear nose and throat provider as hoarseness with smoking are concerning for possible cancer.  It is important that you take your diabetes medications as prescribed.  Keep a log of your blood sugars for your primary care doctor.  Your chest x-ray was normal.  Your EKG was normal.  Continue to use your albuterol inhaler as needed.  Stop by the pharmacy to pick up your antibiotics and your steroids.  I also refilled your promethazine cough syrup.       ED Prescriptions     Medication Sig Dispense Auth. Provider   promethazine-dextromethorphan (PROMETHAZINE-DM) 6.25-15 MG/5ML syrup Take 5 mLs by mouth 4 (four) times daily as needed. 118 mL Shown Dissinger, DO   predniSONE (STERAPRED UNI-PAK 21 TAB) 10 MG (21) TBPK tablet Take by mouth daily. Take 6 tabs by mouth daily for 1, then 5 tabs for 1  day, then 4 tabs for 1 day, then 3 tabs for 1 day, then 2 tabs for 1 day, then 1 tab for 1 day. 21 tablet Carolann Brazell, DO   azithromycin (ZITHROMAX Z-PAK) 250 MG tablet Take 2 tablets on day 1 then 1  tablet daily 6 tablet Zahid Carneiro, DO      PDMP not reviewed this encounter.       Katha Cabal, DO 06/09/23 1009

## 2023-06-09 NOTE — Discharge Instructions (Addendum)
Be sure to keep your appointment with your ear nose and throat provider as hoarseness with smoking are concerning for possible cancer.  It is important that you take your diabetes medications as prescribed.  Keep a log of your blood sugars for your primary care doctor.  Your chest x-ray was normal.  Your EKG was normal.  Continue to use your albuterol inhaler as needed.  Stop by the pharmacy to pick up your antibiotics and your steroids.  I also refilled your promethazine cough syrup.

## 2023-06-09 NOTE — ED Triage Notes (Signed)
Pt c/o sob and hoarseness x 3 weeks. She feels achy and fatigued. She states her right eye is draining and sensitive to light.

## 2023-06-12 ENCOUNTER — Telehealth: Payer: Medicaid Other | Admitting: Family Medicine

## 2023-06-12 DIAGNOSIS — H109 Unspecified conjunctivitis: Secondary | ICD-10-CM

## 2023-06-12 MED ORDER — ALBUTEROL SULFATE HFA 108 (90 BASE) MCG/ACT IN AERS: 1.0000 | INHALATION_SPRAY | Freq: Four times a day (QID) | RESPIRATORY_TRACT | 0 refills | Status: DC | PRN

## 2023-06-12 MED ORDER — POLYMYXIN B-TRIMETHOPRIM 10000-0.1 UNIT/ML-% OP SOLN: 1.0000 [drp] | OPHTHALMIC | 0 refills | Status: AC

## 2023-06-12 NOTE — Progress Notes (Signed)
 Virtual Visit Consent   Angela Landry, you are scheduled for a virtual visit with a [facility] provider today. Just as with appointments in the office, your consent must be obtained to participate. Your consent will be active for this visit and any virtual visit you may have with one of our providers in the next 365 days. If you have a MyChart account, a copy of this consent can be sent to you electronically.  As this is a virtual visit, video technology does not allow for your provider to perform a traditional examination. This may limit your provider's ability to fully assess your condition. If your provider identifies any concerns that need to be evaluated in person or the need to arrange testing (such as labs, EKG, etc.), we will make arrangements to do so. Although advances in technology are sophisticated, we cannot ensure that it will always work on either your end or our end. If the connection with a video visit is poor, the visit may have to be switched to a telephone visit. With either a video or telephone visit, we are not always able to ensure that we have a secure connection.  By engaging in this virtual visit, you consent to the provision of healthcare and authorize for your insurance to be billed (if applicable) for the services provided during this visit. Depending on your insurance coverage, you may receive a charge related to this service.  I need to obtain your verbal consent now. Are you willing to proceed with your visit today? Angela Landry has provided verbal consent on 06/12/2023 for a virtual visit (video or telephone). Angela Landry, [place]  Date: 06/12/2023 2:49 PM  Virtual Visit via Video Note   I, Angela Landry, connected with  Angela Landry  ([id number], 15-Jan-1981) on 06/12/23 at  2:45 PM EST by a video-enabled telemedicine application and verified that I am speaking with the correct person using two identifiers.  Location: Patient: Virtual Visit Location Patient:  [employer], [employer]  Provider: Virtual Visit Location Provider: Home Office   I discussed the limitations of evaluation and management by telemedicine and the availability of in person appointments. The patient expressed understanding and agreed to proceed.    History of Present Illness: Angela Landry is a 43 y.o. who identifies as a female who was assigned female at birth, and is being seen today for c/o having pain in both eyes and watering. Pt states the left eye has discharge.  Pt denies itching or burning. Pt states using clear eye.  Pt states right eye pain started two days ago and the left eye started yesterday. Pt also requests refill on albuterol  inhaler.   HPI: HPI  Problems: There are no active problems to display for this patient.   Allergies: No Known Allergies Medications:  Current Outpatient Medications:    trimethoprim -polymyxin b  (POLYTRIM ) ophthalmic solution, Place 1 drop into both eyes every 4 (four) hours for 5 days., Disp: 10 mL, Rfl: 0   ACCU-CHEK GUIDE test strip, USE 1-4 TIMES DAILY AS DIRECTED BY PHYSICIAN TO MONITOR BLOOD SUGARS, Disp: , Rfl:    albuterol  (VENTOLIN  HFA) 108 (90 Base) MCG/ACT inhaler, Inhale 1-2 puffs into the lungs every 6 (six) hours as needed for wheezing or shortness of breath., Disp: 1 g, Rfl: 0   ARMOUR THYROID 120 MG tablet, Take 120 mg by mouth every morning., Disp: , Rfl:    ASPIRIN  LOW DOSE 81 MG EC tablet, Take 81 mg by mouth daily., Disp: , Rfl:  azithromycin  (ZITHROMAX  Z-PAK) 250 MG tablet, Take 2 tablets on day 1 then 1 tablet daily, Disp: 6 tablet, Rfl: 0   DULoxetine  (CYMBALTA ) 60 MG capsule, Take 60 mg by mouth daily., Disp: , Rfl:    gabapentin  (NEURONTIN ) 300 MG capsule, Take 3 capsules by mouth daily., Disp: , Rfl:    ipratropium (ATROVENT ) 0.06 % nasal spray, Place 2 sprays into both nostrils 4 (four) times daily., Disp: 15 mL, Rfl: 0   levonorgestrel (MIRENA) 20 MCG/DAY IUD, by Intrauterine route., Disp: , Rfl:     lidocaine  (XYLOCAINE ) 2 % solution, Use as directed 15 mLs in the mouth or throat every 3 (three) hours as needed for mouth pain (swish and spit)., Disp: 100 mL, Rfl: 0   linaclotide (LINZESS) 290 MCG CAPS capsule, , Disp: , Rfl:    lisinopril  (ZESTRIL ) 10 MG tablet, Take 1 tablet by mouth daily., Disp: , Rfl:    metFORMIN  (GLUCOPHAGE ) 500 MG tablet, Take by mouth., Disp: , Rfl:    naproxen  sodium (ANAPROX  DS) 550 MG tablet, Take 1 tablet (550 mg total) by mouth 2 (two) times daily with a meal., Disp: 30 tablet, Rfl: 0   norethindrone (MICRONOR) 0.35 MG tablet, Take 1 tablet by mouth daily., Disp: , Rfl:    OZEMPIC, 0.25 OR 0.5 MG/DOSE, 2 MG/1.5ML SOPN, Inject 0.5 mg into the skin once a week., Disp: , Rfl:    pantoprazole  (PROTONIX ) 40 MG tablet, Take 1 tablet by mouth daily., Disp: , Rfl:    phenol (CHLORASEPTIC GARGLE) 1.4 % LIQD, Use as directed 1 spray in the mouth or throat as needed for throat irritation / pain., Disp: 20 mL, Rfl: 0   predniSONE  (STERAPRED UNI-PAK 21 TAB) 10 MG (21) TBPK tablet, Take by mouth daily. Take 6 tabs by mouth daily for 1, then 5 tabs for 1 day, then 4 tabs for 1 day, then 3 tabs for 1 day, then 2 tabs for 1 day, then 1 tab for 1 day., Disp: 21 tablet, Rfl: 0   promethazine -dextromethorphan (PROMETHAZINE -DM) 6.25-15 MG/5ML syrup, Take 5 mLs by mouth 4 (four) times daily as needed., Disp: 118 mL, Rfl: 0   Spacer/Aero-Holding Chambers (AEROCHAMBER MV) inhaler, Use as instructed, Disp: 1 each, Rfl: 1   tranexamic acid (LYSTEDA) 650 MG TABS tablet, Take by mouth., Disp: , Rfl:    traZODone  (DESYREL ) 50 MG tablet, TAKE 1/2 TO 1 TABLET BY MOUTH EVERY NIGHT 1 HOUR BEFORE BEDTIME, Disp: , Rfl:   Observations/Objective: Patient is well-developed, well-nourished in no acute distress.  Resting comfortably in car  Head is normocephalic, atraumatic.  No labored breathing.  Speech is clear and coherent with logical content.  Patient is alert and oriented at baseline.     Assessment and Plan: 1. Conjunctivitis of both eyes, unspecified conjunctivitis type (Primary) - trimethoprim -polymyxin b  (POLYTRIM ) ophthalmic solution; Place 1 drop into both eyes every 4 (four) hours for 5 days.  Dispense: 10 mL; Refill: 0  -Will treat for possible conjunctivitis -Advised Pt to F/U in person if symptoms persist   Follow Up Instructions: I discussed the assessment and treatment plan with the patient. The patient was provided an opportunity to ask questions and all were answered. The patient agreed with the plan and demonstrated an understanding of the instructions.  A copy of instructions were sent to the patient via MyChart unless otherwise noted below.    The patient was advised to call back or seek an in-person evaluation if the symptoms worsen or if the  condition fails to improve as anticipated.    Angela Mater, PA-C

## 2023-06-12 NOTE — Patient Instructions (Signed)
 Angela Landry, thank you for joining Roosvelt Mater, PA-C for today's virtual visit.  While this provider is not your primary care provider (PCP), if your PCP is located in our provider database this encounter information will be shared with them immediately following your visit.   A [facility] MyChart account gives you access to today's visit and all your visits, tests, and labs performed at [facility]  click here if you don't have a [facility] MyChart account or go to [email or web address]  Consent: (Patient) Angela Landry provided verbal consent for this virtual visit at the beginning of the encounter.  Current Medications:  Current Outpatient Medications:    trimethoprim -polymyxin b  (POLYTRIM ) ophthalmic solution, Place 1 drop into both eyes every 4 (four) hours for 5 days., Disp: 10 mL, Rfl: 0   ACCU-CHEK GUIDE test strip, USE 1-4 TIMES DAILY AS DIRECTED BY PHYSICIAN TO MONITOR BLOOD SUGARS, Disp: , Rfl:    albuterol  (VENTOLIN  HFA) 108 (90 Base) MCG/ACT inhaler, Inhale 1-2 puffs into the lungs every 6 (six) hours as needed for wheezing or shortness of breath., Disp: 1 g, Rfl: 0   ARMOUR THYROID 120 MG tablet, Take 120 mg by mouth every morning., Disp: , Rfl:    ASPIRIN  LOW DOSE 81 MG EC tablet, Take 81 mg by mouth daily., Disp: , Rfl:    azithromycin  (ZITHROMAX  Z-PAK) 250 MG tablet, Take 2 tablets on day 1 then 1 tablet daily, Disp: 6 tablet, Rfl: 0   DULoxetine  (CYMBALTA ) 60 MG capsule, Take 60 mg by mouth daily., Disp: , Rfl:    gabapentin  (NEURONTIN ) 300 MG capsule, Take 3 capsules by mouth daily., Disp: , Rfl:    ipratropium (ATROVENT ) 0.06 % nasal spray, Place 2 sprays into both nostrils 4 (four) times daily., Disp: 15 mL, Rfl: 0   levonorgestrel (MIRENA) 20 MCG/DAY IUD, by Intrauterine route., Disp: , Rfl:    lidocaine  (XYLOCAINE ) 2 % solution, Use as directed 15 mLs in the mouth or throat every 3 (three) hours as needed for mouth pain (swish and spit)., Disp:  100 mL, Rfl: 0   linaclotide (LINZESS) 290 MCG CAPS capsule, , Disp: , Rfl:    lisinopril  (ZESTRIL ) 10 MG tablet, Take 1 tablet by mouth daily., Disp: , Rfl:    metFORMIN  (GLUCOPHAGE ) 500 MG tablet, Take by mouth., Disp: , Rfl:    naproxen  sodium (ANAPROX  DS) 550 MG tablet, Take 1 tablet (550 mg total) by mouth 2 (two) times daily with a meal., Disp: 30 tablet, Rfl: 0   norethindrone (MICRONOR) 0.35 MG tablet, Take 1 tablet by mouth daily., Disp: , Rfl:    OZEMPIC, 0.25 OR 0.5 MG/DOSE, 2 MG/1.5ML SOPN, Inject 0.5 mg into the skin once a week., Disp: , Rfl:    pantoprazole  (PROTONIX ) 40 MG tablet, Take 1 tablet by mouth daily., Disp: , Rfl:    phenol (CHLORASEPTIC GARGLE) 1.4 % LIQD, Use as directed 1 spray in the mouth or throat as needed for throat irritation / pain., Disp: 20 mL, Rfl: 0   predniSONE  (STERAPRED UNI-PAK 21 TAB) 10 MG (21) TBPK tablet, Take by mouth daily. Take 6 tabs by mouth daily for 1, then 5 tabs for 1 day, then 4 tabs for 1 day, then 3 tabs for 1 day, then 2 tabs for 1 day, then 1 tab for 1 day., Disp: 21 tablet, Rfl: 0   promethazine -dextromethorphan (PROMETHAZINE -DM) 6.25-15 MG/5ML syrup, Take 5 mLs by mouth 4 (four) times daily as needed., Disp: 118 mL, Rfl: 0  Spacer/Aero-Holding Chambers (AEROCHAMBER MV) inhaler, Use as instructed, Disp: 1 each, Rfl: 1   tranexamic acid (LYSTEDA) 650 MG TABS tablet, Take by mouth., Disp: , Rfl:    traZODone  (DESYREL ) 50 MG tablet, TAKE 1/2 TO 1 TABLET BY MOUTH EVERY NIGHT 1 HOUR BEFORE BEDTIME, Disp: , Rfl:    Medications ordered in this encounter:  Meds ordered this encounter  Medications   trimethoprim -polymyxin b  (POLYTRIM ) ophthalmic solution    Sig: Place 1 drop into both eyes every 4 (four) hours for 5 days.    Dispense:  10 mL    Refill:  0   albuterol  (VENTOLIN  HFA) 108 (90 Base) MCG/ACT inhaler    Sig: Inhale 1-2 puffs into the lungs every 6 (six) hours as needed for wheezing or shortness of breath.    Dispense:  1 g     Refill:  0     *If you need refills on other medications prior to your next appointment, please contact your pharmacy*  Follow-Up: Call back or seek an in-person evaluation if the symptoms worsen or if the condition fails to improve as anticipated.  [facility] [phone]  Other Instructions Bacterial Conjunctivitis, Adult Bacterial conjunctivitis is an infection of the clear membrane that covers the white part of the eye and the inner surface of the eyelid (conjunctiva). When the blood vessels in the conjunctiva become inflamed, the eye becomes red or pink. The eye often feels irritated or itchy. Bacterial conjunctivitis spreads easily from person to person (is contagious). It also spreads easily from one eye to the other eye. What are the causes? This condition is caused by bacteria. You may get the infection if you come into close contact with: A person who is infected with the bacteria. Items that are contaminated with the bacteria, such as a face towel, contact lens solution, or eye makeup. What increases the risk? You are more likely to develop this condition if: You are exposed to other people who have the infection. You wear contact lenses. You have a sinus infection. You have had a recent eye injury or surgery. You have a weak body defense system (immune system). You have a medical condition that causes dry eyes. What are the signs or symptoms? Symptoms of this condition include: Thick, yellowish discharge from the eye. This may turn into a crust on the eyelid overnight and cause your eyelids to stick together. Tearing or watery eyes. Itchy eyes. Burning feeling in your eyes. Eye redness. Swollen eyelids. Blurred vision. How is this diagnosed? This condition is diagnosed based on your symptoms and medical history. Your health care provider may also take a sample of discharge from your eye to find the cause of your infection. How is this treated? This  condition may be treated with: Antibiotic eye drops or ointment to clear the infection more quickly and prevent the spread of infection to others. Antibiotic medicines taken by mouth (orally) to treat infections that do not respond to drops or ointments or that last longer than 10 days. Cool, wet cloths (cool compresses) placed on the eyes. Artificial tears applied 2-6 times a day. Follow these instructions at home: Medicines Take or apply your antibiotic medicine as told by your health care provider. Do not stop using the antibiotic, even if your condition improves, unless directed by your health care provider. Take or apply over-the-counter and prescription medicines only as told by your health care provider. Be very careful to avoid touching the edge of your eyelid  with the eye-drop bottle or the ointment tube when you apply medicines to the affected eye. This will keep you from spreading the infection to your other eye or to other people. Managing discomfort Gently wipe away any drainage from your eye with a warm, wet washcloth or a cotton ball. Apply a clean, cool compress to your eye for 10-20 minutes, 3-4 times a day. General instructions Do not wear contact lenses until the inflammation is gone and your health care provider says it is safe to wear them again. Ask your health care provider how to sterilize or replace your contact lenses before you use them again. Wear glasses until you can resume wearing contact lenses. Avoid wearing eye makeup until the inflammation is gone. Throw away any old eye cosmetics that may be contaminated. Change or wash your pillowcase every day. Do not share towels or washcloths. This may spread the infection. Wash your hands often with soap and water for at least 20 seconds and especially before touching your face or eyes. Use paper towels to dry your hands. Avoid touching or rubbing your eyes. Do not drive or use heavy machinery if your vision is  blurred. Contact a health care provider if: You have a fever. Your symptoms do not get better after 10 days. Get help right away if: You have a fever and your symptoms suddenly get worse. You have severe pain when you move your eye. You have facial pain, redness, or swelling. You have a sudden loss of vision. Summary Bacterial conjunctivitis is an infection of the clear membrane that covers the white part of the eye and the inner surface of the eyelid (conjunctiva). Bacterial conjunctivitis spreads easily from eye to eye and from person to person (is contagious). Wash your hands often with soap and water for at least 20 seconds and especially before touching your face or eyes. Use paper towels to dry your hands. Take or apply your antibiotic medicine as told by your health care provider. Do not stop using the antibiotic even if your condition improves. Contact a health care provider if you have a fever or if your symptoms do not get better after 10 days. Get help right away if you have a sudden loss of vision. This information is not intended to replace advice given to you by your health care provider. Make sure you discuss any questions you have with your health care provider. Document Revised: 09/07/2020 Document Reviewed: 09/07/2020 Elsevier Patient Education  2024 [employer].    If you have been instructed to have an in-person evaluation today at a local Urgent Care facility, please use the link below. It will take you to a list of all of our available [facility], including address, phone number and hours of operation. Please do not delay care.  [facility]  If you or a family member do not have a primary care provider, use the link below to schedule a visit and establish care. When you choose a [facility] primary care physician or advanced practice provider, you gain a long-term partner in health. Find a Primary Care Provider  Learn more about [facility]'s in-office and virtual care options: [facility] - Get Care Now

## 2023-06-19 ENCOUNTER — Emergency Department
Admission: EM | Admit: 2023-06-19 | Discharge: 2023-06-19 | Disposition: A | Discharge status: Home / Self Care | Payer: Medicaid Other | Attending: Emergency Medicine | Admitting: Emergency Medicine

## 2023-06-19 ENCOUNTER — Other Ambulatory Visit: Payer: Self-pay

## 2023-06-19 DIAGNOSIS — E119 Type 2 diabetes mellitus without complications: Secondary | ICD-10-CM | POA: Diagnosis not present

## 2023-06-19 DIAGNOSIS — Z20822 Contact with and (suspected) exposure to covid-19: Secondary | ICD-10-CM | POA: Diagnosis not present

## 2023-06-19 DIAGNOSIS — J029 Acute pharyngitis, unspecified: Secondary | ICD-10-CM | POA: Insufficient documentation

## 2023-06-19 DIAGNOSIS — R0982 Postnasal drip: Secondary | ICD-10-CM

## 2023-06-19 LAB — RESP PANEL BY RT-PCR (RSV, FLU A&B, COVID)  RVPGX2
Influenza A by PCR: NEGATIVE
Influenza B by PCR: NEGATIVE
Resp Syncytial Virus by PCR: NEGATIVE
SARS Coronavirus 2 by RT PCR: NEGATIVE

## 2023-06-19 LAB — GROUP A STREP BY PCR: Group A Strep by PCR: NOT DETECTED

## 2023-06-19 MED ORDER — LIDOCAINE VISCOUS HCL 2 % MT SOLN
15.0000 mL | Freq: Once | OROMUCOSAL | Status: AC
  Administered 2023-06-19: 15 mL via OROMUCOSAL
  Filled 2023-06-19: qty 15

## 2023-06-19 MED ORDER — FLUTICASONE PROPIONATE 50 MCG/ACT NA SUSP: 2.0000 | Freq: Every day | NASAL | 0 refills | Status: DC

## 2023-06-19 MED ORDER — LIDOCAINE VISCOUS HCL 2 % MT SOLN: 15.0000 mL | Freq: Four times a day (QID) | OROMUCOSAL | 0 refills | Status: AC | PRN

## 2023-06-19 MED ORDER — DEXAMETHASONE SODIUM PHOSPHATE 10 MG/ML IJ SOLN
10.0000 mg | Freq: Once | INTRAMUSCULAR | Status: AC
  Administered 2023-06-19: 10 mg via INTRAMUSCULAR
  Filled 2023-06-19: qty 1

## 2023-06-19 MED ORDER — DEXAMETHASONE 4 MG PO TABS: 8.0000 mg | ORAL_TABLET | Freq: Once | ORAL | Status: DC

## 2023-06-19 NOTE — ED Provider Notes (Signed)
 [facility] Provider Note    Event Date/Time   First MD Initiated Contact with Patient 06/19/23 [phone]     (approximate)   History   Sore Throat   HPI  Angela Landry is a 43 y.o. female   Past medical history of gall use, diabetes, fibromyalgia and GERD who presents emergency department with sore throat.  This been ongoing for several weeks.  She drinks a significant amount of alcohol.  She has been seen by multiple providers for sore throat and postnasal drip over the last several weeks and has finished several bouts of antibiotics and has tried steroids with no resolution of symptoms.  She has a hoarse voice.  She denies any cough fevers or chills.  No chest pain.  No shortness of breath.  Is an upcoming appointment with [facility] ENT next week.  External Medical Documents Reviewed: Urgent care note from June 09, 2023 for sore throat, hoarse voice, treated with antibiotics and referred to [facility]NT for further evaluation for concerns possible malignancy workup.      Physical Exam   Triage Vital Signs: ED Triage Vitals  Encounter Vitals Group     BP 06/19/23 0238 (!) 151/89     Systolic BP Percentile --      Diastolic BP Percentile --      Pulse Rate 06/19/23 0238 (!) 103     Resp 06/19/23 0238 20     Temp 06/19/23 0238 98.4 F (36.9 C)     Temp Source 06/19/23 0238 Oral     SpO2 06/19/23 0238 98 %     Weight 06/19/23 0237 212 lb (96.2 kg)     Height 06/19/23 0237 5' 4 (1.626 m)     Head Circumference --      Peak Flow --      Pain Score 06/19/23 0237 8     Pain Loc --      Pain Education --      Exclude from Growth Chart --     Most recent vital signs: Vitals:   06/19/23 0238  BP: (!) 151/89  Pulse: (!) 103  Resp: 20  Temp: 98.4 F (36.9 C)  SpO2: 98%    General: Awake, no distress.  CV:  Good peripheral perfusion.  Resp:  Normal effort.  Abd:  No distention.  Other:     ED Results / Procedures / Treatments   Labs (all  labs ordered are listed, but only abnormal results are displayed) Labs Reviewed  GROUP A STREP BY PCR  RESP PANEL BY RT-PCR (RSV, FLU A&B, COVID)  [id number]    I ordered and reviewed the above labs they are notable for group A strep and respiratory viral panel negative   PROCEDURES:  Critical Care performed: No  Procedures   MEDICATIONS ORDERED IN ED: Medications  dexamethasone  (DECADRON ) injection 10 mg (10 mg Intramuscular Given 06/19/23 0536)  lidocaine  (XYLOCAINE ) 2 % viscous mouth solution 15 mL (15 mLs Mouth/Throat Given 06/19/23 0536)     IMPRESSION / MDM / ASSESSMENT AND PLAN / ED COURSE  I reviewed the triage vital signs and the nursing notes.                                Patient's presentation is most consistent with acute presentation with potential threat to life or bodily function.  Differential diagnosis includes, but is not limited to, pharyngitis/laryngitis, strep pharyngitis, deep space  neck infection, abscess, postnasal drip, malignancy   MDM:    Subacute symptoms, unchanged pharyngitis and hoarse voice.  Patient states some nasal congestion and sensation of postnasal drip as well.  Will try intranasal steroids, dexamethasone , and I do think the next best step is to follow-up with [facility] ENT as she has scheduled for rule out malignancy.  She looks well, and her exam does not show any signs of neck masses, posterior oropharyngeal masses, and I doubt deep space neck infection or abscess or meningitis given her overall well appearance.  Her viral swabs are negative.  Her strep a test was negative.  She will be discharged with ENT follow-up.       FINAL CLINICAL IMPRESSION(S) / ED DIAGNOSES   Final diagnoses:  Pharyngitis, unspecified etiology  Postnasal drip     Rx / DC Orders   ED Discharge Orders          Ordered    fluticasone  (FLONASE ) 50 MCG/ACT nasal spray  Daily        06/19/23 0521             Note:  This document was prepared using  Dragon voice recognition software and may include unintentional dictation errors.    Cyrena Mylar, MD 06/19/23 [phone]

## 2023-06-19 NOTE — ED Triage Notes (Signed)
 Pt to ED via EMS from home, pt c/o sore throat and hoarse voice, pt states she drank a gallon of liquor in the past 24 hours. Pt states seh had strep throat a few weeks ago.

## 2023-06-19 NOTE — ED Notes (Signed)
AVS provided by edp was discussed with pt. Pt verbalized understanding with no additional questions at this time.

## 2023-07-03 ENCOUNTER — Other Ambulatory Visit: Payer: Self-pay

## 2023-07-03 ENCOUNTER — Emergency Department: Payer: Medicaid Other

## 2023-07-03 ENCOUNTER — Emergency Department
Admission: EM | Admit: 2023-07-03 | Discharge: 2023-07-03 | Disposition: A | Discharge status: Home / Self Care | Payer: Medicaid Other | Attending: Emergency Medicine | Admitting: Emergency Medicine

## 2023-07-03 ENCOUNTER — Encounter: Payer: Self-pay | Admitting: Emergency Medicine

## 2023-07-03 DIAGNOSIS — Y907 Blood alcohol level of 200-239 mg/100 ml: Secondary | ICD-10-CM | POA: Diagnosis not present

## 2023-07-03 DIAGNOSIS — E119 Type 2 diabetes mellitus without complications: Secondary | ICD-10-CM | POA: Insufficient documentation

## 2023-07-03 DIAGNOSIS — R079 Chest pain, unspecified: Secondary | ICD-10-CM | POA: Diagnosis not present

## 2023-07-03 DIAGNOSIS — F1099 Alcohol use, unspecified with unspecified alcohol-induced disorder: Secondary | ICD-10-CM | POA: Insufficient documentation

## 2023-07-03 DIAGNOSIS — M79661 Pain in right lower leg: Secondary | ICD-10-CM | POA: Diagnosis not present

## 2023-07-03 DIAGNOSIS — Z794 Long term (current) use of insulin: Secondary | ICD-10-CM | POA: Insufficient documentation

## 2023-07-03 DIAGNOSIS — F17200 Nicotine dependence, unspecified, uncomplicated: Secondary | ICD-10-CM | POA: Insufficient documentation

## 2023-07-03 DIAGNOSIS — K700 Alcoholic fatty liver: Secondary | ICD-10-CM | POA: Diagnosis not present

## 2023-07-03 DIAGNOSIS — F1090 Alcohol use, unspecified, uncomplicated: Secondary | ICD-10-CM

## 2023-07-03 DIAGNOSIS — Z7984 Long term (current) use of oral hypoglycemic drugs: Secondary | ICD-10-CM | POA: Diagnosis not present

## 2023-07-03 DIAGNOSIS — K760 Fatty (change of) liver, not elsewhere classified: Secondary | ICD-10-CM

## 2023-07-03 LAB — CBC WITH DIFFERENTIAL/PLATELET
Abs Immature Granulocytes: 0.04 10*3/uL (ref 0.00–0.07)
Basophils Absolute: 0.1 10*3/uL (ref 0.0–0.1)
Basophils Relative: 1 %
Eosinophils Absolute: 0 10*3/uL (ref 0.0–0.5)
Eosinophils Relative: 0 %
HCT: 37.8 % (ref 36.0–46.0)
Hemoglobin: 13.5 g/dL (ref 12.0–15.0)
Immature Granulocytes: 0 %
Lymphocytes Relative: 21 %
Lymphs Abs: 2 10*3/uL (ref 0.7–4.0)
MCH: 33.8 pg (ref 26.0–34.0)
MCHC: 35.7 g/dL (ref 30.0–36.0)
MCV: 94.5 fL (ref 80.0–100.0)
Monocytes Absolute: 0.5 10*3/uL (ref 0.1–1.0)
Monocytes Relative: 6 %
Neutro Abs: 6.7 10*3/uL (ref 1.7–7.7)
Neutrophils Relative %: 72 %
Platelets: 444 10*3/uL — ABNORMAL HIGH (ref 150–400)
RBC: 4 MIL/uL (ref 3.87–5.11)
RDW: 18.6 % — ABNORMAL HIGH (ref 11.5–15.5)
WBC: 9.4 10*3/uL (ref 4.0–10.5)
nRBC: 0 % (ref 0.0–0.2)

## 2023-07-03 LAB — COMPREHENSIVE METABOLIC PANEL
ALT: 23 U/L (ref 0–44)
AST: 37 U/L (ref 15–41)
Albumin: 3.9 g/dL (ref 3.5–5.0)
Alkaline Phosphatase: 101 U/L (ref 38–126)
Anion gap: 21 — ABNORMAL HIGH (ref 5–15)
BUN: <5 mg/dL — ABNORMAL LOW (ref 6–20)
CO2: 20 mmol/L — ABNORMAL LOW (ref 22–32)
Calcium: 8.9 mg/dL (ref 8.9–10.3)
Chloride: 97 mmol/L — ABNORMAL LOW (ref 98–111)
Creatinine, Ser: 0.6 mg/dL (ref 0.44–1.00)
GFR, Estimated: >60 mL/min (ref >60)
Glucose, Bld: 242 mg/dL — ABNORMAL HIGH (ref 70–99)
Potassium: 3.6 mmol/L (ref 3.5–5.1)
Sodium: 138 mmol/L (ref 135–145)
Total Bilirubin: 1.2 mg/dL (ref 0.0–1.2)
Total Protein: 7.3 g/dL (ref 6.5–8.1)

## 2023-07-03 LAB — TROPONIN I (HIGH SENSITIVITY)
Troponin I (High Sensitivity): 13 ng/L (ref <18)
Troponin I (High Sensitivity): 11 ng/L (ref <18)

## 2023-07-03 LAB — MAGNESIUM: Magnesium: 1.9 mg/dL (ref 1.7–2.4)

## 2023-07-03 LAB — URINALYSIS, ROUTINE W REFLEX MICROSCOPIC
Bacteria, UA: NONE SEEN
Bilirubin Urine: NEGATIVE
Glucose, UA: >=500 mg/dL — AB
Hgb urine dipstick: NEGATIVE
Ketones, ur: 20 mg/dL — AB
Leukocytes,Ua: NEGATIVE
Nitrite: NEGATIVE
Protein, ur: NEGATIVE mg/dL
RBC / HPF: 0 RBC/hpf (ref 0–5)
Specific Gravity, Urine: >1.046 — ABNORMAL HIGH (ref 1.005–1.030)
pH: 5 (ref 5.0–8.0)

## 2023-07-03 LAB — BLOOD GAS, VENOUS
Acid-base deficit: 3.9 mmol/L — ABNORMAL HIGH (ref 0.0–2.0)
Bicarbonate: 20.8 mmol/L (ref 20.0–28.0)
O2 Saturation: 84 %
Patient temperature: 37
pCO2, Ven: 36 mm[Hg] — ABNORMAL LOW (ref 44–60)
pH, Ven: 7.37 (ref 7.25–7.43)
pO2, Ven: 53 mm[Hg] — ABNORMAL HIGH (ref 32–45)

## 2023-07-03 LAB — ETHANOL: Alcohol, Ethyl (B): 225 mg/dL — ABNORMAL HIGH (ref <10)

## 2023-07-03 LAB — BETA-HYDROXYBUTYRIC ACID: Beta-Hydroxybutyric Acid: 0.88 mmol/L — ABNORMAL HIGH (ref 0.05–0.27)

## 2023-07-03 LAB — HCG, QUANTITATIVE, PREGNANCY: hCG, Beta Chain, Quant, S: <1 m[IU]/mL (ref <5)

## 2023-07-03 LAB — D-DIMER, QUANTITATIVE: D-Dimer, Quant: 2.69 ug{FEU}/mL — ABNORMAL HIGH (ref 0.00–0.50)

## 2023-07-03 LAB — POC URINE PREG, ED: Preg Test, Ur: NEGATIVE

## 2023-07-03 MED ORDER — ONDANSETRON HCL 4 MG/2ML IJ SOLN
4.0000 mg | Freq: Once | INTRAMUSCULAR | Status: DC
Start: 2023-07-03 — End: 2023-07-03
  Filled 2023-07-03: qty 2

## 2023-07-03 MED ORDER — THIAMINE HCL 100 MG/ML IJ SOLN
100.0000 mg | Freq: Once | INTRAMUSCULAR | Status: AC
Start: 2023-07-03 — End: 2023-07-03
  Administered 2023-07-03: 100 mg via INTRAVENOUS
  Filled 2023-07-03: qty 2

## 2023-07-03 MED ORDER — PANTOPRAZOLE SODIUM 40 MG PO TBEC: 40.0000 mg | DELAYED_RELEASE_TABLET | Freq: Every day | ORAL | 1 refills | Status: DC

## 2023-07-03 MED ORDER — ALUM & MAG HYDROXIDE-SIMETH 200-200-20 MG/5ML PO SUSP
30.0000 mL | Freq: Once | ORAL | Status: AC
Start: 2023-07-03 — End: 2023-07-03
  Administered 2023-07-03: 30 mL via ORAL
  Filled 2023-07-03: qty 30

## 2023-07-03 MED ORDER — PANTOPRAZOLE SODIUM 40 MG PO TBEC
40.0000 mg | DELAYED_RELEASE_TABLET | Freq: Once | ORAL | Status: AC
  Administered 2023-07-03: 40 mg via ORAL
  Filled 2023-07-03: qty 1

## 2023-07-03 MED ORDER — SODIUM CHLORIDE 0.9 % IV BOLUS (SEPSIS)
1000.0000 mL | Freq: Once | INTRAVENOUS | Status: AC
  Administered 2023-07-03: 1000 mL via INTRAVENOUS

## 2023-07-03 MED ORDER — THIAMINE HCL 100 MG PO TABS: 100.0000 mg | ORAL_TABLET | Freq: Every day | ORAL | 3 refills | Status: DC

## 2023-07-03 MED ORDER — IOHEXOL 350 MG/ML SOLN
75.0000 mL | Freq: Once | INTRAVENOUS | Status: AC | PRN
  Administered 2023-07-03: 75 mL via INTRAVENOUS

## 2023-07-03 MED ORDER — METFORMIN HCL 500 MG PO TABS: 500.0000 mg | ORAL_TABLET | Freq: Two times a day (BID) | ORAL | 1 refills | Status: DC

## 2023-07-03 MED ORDER — FOLIC ACID 1 MG PO TABS: 1.0000 mg | ORAL_TABLET | Freq: Every day | ORAL | 3 refills | Status: DC

## 2023-07-03 MED ORDER — ONDANSETRON 4 MG PO TBDP
4.0000 mg | ORAL_TABLET | Freq: Four times a day (QID) | ORAL | 0 refills | Status: DC | PRN
Start: 2023-07-03 — End: 2023-07-24

## 2023-07-03 MED ORDER — LORAZEPAM 2 MG/ML IJ SOLN
1.0000 mg | Freq: Once | INTRAMUSCULAR | Status: AC
  Administered 2023-07-03: 1 mg via INTRAVENOUS
  Filled 2023-07-03: qty 1

## 2023-07-03 NOTE — ED Notes (Signed)
Ultrasound in progress  

## 2023-07-03 NOTE — ED Notes (Signed)
Report to amber, rn.  

## 2023-07-03 NOTE — Discharge Instructions (Addendum)
CT scan shows:  IMPRESSION:  1. No CT evidence for acute pulmonary embolus.  2. Enlargement of the pulmonary outflow tract/main pulmonary  arteries suggests pulmonary arterial hypertension.  3. Fairly marked irregular circumferential wall thickening in the  distal esophagus. This may be related to esophagitis, but follow-up  short-term upper endoscopy recommended to further evaluate as  neoplasm cannot be excluded.  4. Hepatic steatosis.   Please follow up with GI for these abnormal CT results.  Please take Protonix as prescribed.   Your CT scan showed no blood clot in your lungs or pneumonia.  The ultrasound of your right leg showed no blood clot in your leg.  Your cardiac labs were also normal today.  I recommend you follow-up with your primary doctor to discuss ways to stop drinking alcohol.  I recommend taking an over-the-counter multivitamin daily.

## 2023-07-03 NOTE — ED Triage Notes (Addendum)
EMS brings pt in from home for c/o mid CP radiating into back accomp by "heartburn" x 4hrs; +ETOH; took pepcid PTA without relief

## 2023-07-03 NOTE — ED Notes (Signed)
Pt to ct 

## 2023-07-03 NOTE — ED Provider Notes (Addendum)
[facility] Provider Note    Event Date/Time   First MD Initiated Contact with Patient 07/03/23 [phone]     (approximate)   History   Chest Pain   HPI  Angela Landry is a 43 y.o. female with history of GERD, diabetes, fibromyalgia, thyroid disease, reported history of DVT in the right lower extremity who presents to the emergency department complaints of chest pain that radiates into her back, shortness of breath, nausea and vomiting.  She reports she has had a dry cough and a hoarse voice for a month and has had negative strep test, COVID and flu test.  Reports she has an appointment to see ENT in 5 days but is requesting to see them emergently in the ED today.  States she has been told she has bronchitis.  She is a smoker.  Reports she did drink alcohol yesterday.  She denies drug use.  States she is no longer on any blood thinners.  Denies history of PE.  Patient requesting a GI cocktail.   History provided by patient.    Past Medical History:  Diagnosis Date   Diabetes mellitus without complication (HCC)    Fibromyalgia    GERD (gastroesophageal reflux disease)    IBS (irritable bowel syndrome)    Thyroid disease     History reviewed. No pertinent surgical history.  MEDICATIONS:  Prior to Admission medications   Medication Sig Start Date End Date Taking? Authorizing Provider  ACCU-CHEK GUIDE test strip USE 1-4 TIMES DAILY AS DIRECTED BY PHYSICIAN TO MONITOR BLOOD SUGARS 10/10/20   [provider]  albuterol (VENTOLIN HFA) 108 (90 Base) MCG/ACT inhaler Inhale 1-2 puffs into the lungs every 6 (six) hours as needed for wheezing or shortness of breath. 06/12/23   Reed Pandy, PA-C  ARMOUR THYROID 120 MG tablet Take 120 mg by mouth every morning. 10/10/20   [provider]  ASPIRIN LOW DOSE 81 MG EC tablet Take 81 mg by mouth daily. 10/10/20   [provider]  azithromycin (ZITHROMAX Z-PAK) 250 MG tablet Take 2 tablets on day 1  then 1 tablet daily 06/09/23   Katha Cabal, DO  DULoxetine (CYMBALTA) 60 MG capsule Take 60 mg by mouth daily. 10/10/20   [provider]  fluticasone (FLONASE) 50 MCG/ACT nasal spray Place 2 sprays into both nostrils daily. 06/19/23 06/18/24  Pilar Jarvis, MD  gabapentin (NEURONTIN) 300 MG capsule Take 3 capsules by mouth daily. 08/08/20   [provider]  ipratropium (ATROVENT) 0.06 % nasal spray Place 2 sprays into both nostrils 4 (four) times daily. 04/02/23   Domenick Gong, MD  levonorgestrel (MIRENA) 20 MCG/DAY IUD by Intrauterine route.    [provider]  linaclotide Karlene Einstein) 290 MCG CAPS capsule  12/08/15   [provider]  lisinopril (ZESTRIL) 10 MG tablet Take 1 tablet by mouth daily. 03/20/23 03/19/24  [provider]  metFORMIN (GLUCOPHAGE) 500 MG tablet Take by mouth. 12/22/21 03/19/24  [provider]  naproxen sodium (ANAPROX DS) 550 MG tablet Take 1 tablet (550 mg total) by mouth 2 (two) times daily with a meal. 10/15/21   White, Elita Boone, NP  norethindrone (MICRONOR) 0.35 MG tablet Take 1 tablet by mouth daily. 02/08/23 02/08/24  [provider]  OZEMPIC, 0.25 OR 0.5 MG/DOSE, 2 MG/1.5ML SOPN Inject 0.5 mg into the skin once a week. 10/10/20   [provider]  pantoprazole (PROTONIX) 40 MG tablet Take 1 tablet by mouth daily. 10/10/20   [provider]  phenol (CHLORASEPTIC GARGLE) 1.4 % LIQD Use as directed 1 spray in the mouth or throat as needed for throat irritation / pain. 05/28/23   Merwyn Katos, MD  predniSONE (STERAPRED UNI-PAK 21 TAB) 10 MG (21) TBPK tablet Take by mouth daily. Take 6 tabs by mouth daily for 1, then 5 tabs for 1 day, then 4 tabs for 1 day, then 3 tabs for 1 day, then 2 tabs for 1 day, then 1 tab for 1 day. 06/09/23   Katha Cabal, DO  promethazine-dextromethorphan (PROMETHAZINE-DM) 6.25-15 MG/5ML syrup Take 5 mLs by mouth 4 (four) times daily as needed. 06/09/23   Katha Cabal, DO   Spacer/Aero-Holding Chambers (AEROCHAMBER MV) inhaler Use as instructed 04/02/23   Domenick Gong, MD  tranexamic acid (LYSTEDA) 650 MG TABS tablet Take by mouth. 02/08/23   [provider]  traZODone (DESYREL) 50 MG tablet TAKE 1/2 TO 1 TABLET BY MOUTH EVERY NIGHT 1 HOUR BEFORE BEDTIME 12/09/20   [provider]    Physical Exam   Triage Vital Signs: ED Triage Vitals [07/03/23 0149]  Encounter Vitals Group     BP (!) 156/103     Systolic BP Percentile      Diastolic BP Percentile      Pulse Rate (!) 120     Resp 18     Temp 98.6 F (37 C)     Temp Source Oral     SpO2 94 %     Weight 214 lb (97.1 kg)     Height 5\' 5"  (1.651 m)     Head Circumference      Peak Flow      Pain Score 8     Pain Loc      Pain Education      Exclude from Growth Chart     Most recent vital signs: Vitals:   07/03/23 0430 07/03/23 0530  BP: (!) 137/99   Pulse: (!) 117 (!) 101  Resp: 16 18  Temp:    SpO2:      CONSTITUTIONAL: Alert, responds appropriately to questions.  Disheveled, appears intoxicated HEAD: Normocephalic, atraumatic EYES: Conjunctivae clear, pupils appear equal, sclera nonicteric ENT: normal nose; moist mucous membranes, slightly hoarse voice but no muffled voice, no stridor, no trismus or drooling, no tonsillar hypertrophy or exudate, no uvular deviation or swelling, airway patent NECK: Supple, normal ROM CARD: Regular and tachycardic; S1 and S2 appreciated RESP: Normal chest excursion without splinting or tachypnea; breath sounds clear and equal bilaterally; no wheezes, no rhonchi, no rales, no hypoxia or respiratory distress, speaking full sentences ABD/GI: Non-distended; soft, non-tender, no rebound, no guarding, no peritoneal signs BACK: The back appears normal EXT: Normal ROM in all joints; no deformity noted, no edema, no calf tenderness or calf swelling SKIN: Normal color for age and race; warm; no rash on exposed skin NEURO: Moves all extremities  equally, normal speech, no facial asymmetry PSYCH: The patient's mood and manner are appropriate.   ED Results / Procedures / Treatments   LABS: (all labs ordered are listed, but only abnormal results are displayed) Labs Reviewed  CBC WITH DIFFERENTIAL/PLATELET - Abnormal; Notable for the following components:      Result Value   RDW 18.6 (*)    Platelets 444 (*)    All other components within normal limits  COMPREHENSIVE METABOLIC PANEL - Abnormal; Notable for the following components:   Chloride 97 (*)    CO2 20 (*)    Glucose, Bld  242 (*)    BUN <5 (*)    Anion gap 21 (*)    All other components within normal limits  ETHANOL - Abnormal; Notable for the following components:   Alcohol, Ethyl (B) 225 (*)    All other components within normal limits  D-DIMER, QUANTITATIVE - Abnormal; Notable for the following components:   D-Dimer, Quant 2.69 (*)    All other components within normal limits  BLOOD GAS, VENOUS - Abnormal; Notable for the following components:   pCO2, Ven 36 (*)    pO2, Ven 53 (*)    Acid-base deficit 3.9 (*)    All other components within normal limits  URINALYSIS, ROUTINE W REFLEX MICROSCOPIC - Abnormal; Notable for the following components:   Color, Urine YELLOW (*)    APPearance CLEAR (*)    Specific Gravity, Urine >1.046 (*)    Glucose, UA >=500 (*)    Ketones, ur 20 (*)    All other components within normal limits  MAGNESIUM  BETA-HYDROXYBUTYRIC ACID  HCG, QUANTITATIVE, PREGNANCY  POC URINE PREG, ED  TROPONIN I (HIGH SENSITIVITY)  TROPONIN I (HIGH SENSITIVITY)     EKG:  EKG Interpretation Date/Time:  Wednesday July 03 2023 01:54:12 EST Ventricular Rate:  127 PR Interval:    QRS Duration:  80 QT Interval:  412 QTC Calculation: 598 R Axis:   82  Text Interpretation: **Critical Test Result: Long QTc Accelerated Junctional rhythm Nonspecific ST abnormality Abnormal ECG When compared with ECG of 09-Jun-2023 09:23, Junctional rhythm has  replaced Sinus rhythm Vent. rate has increased BY  44 BPM QRS voltage has increased ST now depressed in Lateral leads Confirmed by Auston Halfmann, Baxter Hire [phone]) on 07/03/2023 2:13:11 AM          Date: 07/03/2023 3:37 AM  Rate: 108  Rhythm: Sinus tachycardia  QRS Axis: normal  Intervals: normal  ST/T Wave abnormalities: normal  Conduction Disutrbances: none  Narrative Interpretation: Sinus tachycardia, QTc 469 ms     RADIOLOGY: My personal review and interpretation of imaging: CTA shows no PE.  Right lower extremity ultrasound shows no DVT.  I have personally reviewed all radiology reports.   US Venous Img Lower Unilateral Right Result Date: 07/03/2023 CLINICAL DATA:  Right lower extremity pain. EXAM: RIGHT LOWER EXTREMITY VENOUS DOPPLER ULTRASOUND TECHNIQUE: Gray-scale sonography with compression, as well as color and duplex ultrasound, were performed to evaluate the deep venous system(s) from the level of the common femoral vein through the popliteal and proximal calf veins. COMPARISON:  Similar study of 05/29/2023. FINDINGS: VENOUS Normal compressibility of the common femoral, superficial femoral, and popliteal veins, as well as the visualized calf veins. Visualized portions of profunda femoral vein and great saphenous vein unremarkable. No filling defects to suggest DVT on grayscale or color Doppler imaging. Doppler waveforms show normal direction of venous flow, normal respiratory plasticity and response to augmentation. Limited views of the contralateral common femoral vein are unremarkable. OTHER None. Limitations: none IMPRESSION: No evidence of right lower extremity DVT. Comparison to the prior study reveals no significant interval change. Electronically Signed   By: Almira Bar M.D.   On: 07/03/2023 05:35   CT Angio Chest PE W and/or Wo Contrast Result Date: 07/03/2023 CLINICAL DATA:  Positive D-dimer.  Chest pain radiates to the back. EXAM: CT ANGIOGRAPHY CHEST WITH CONTRAST TECHNIQUE:  Multidetector CT imaging of the chest was performed using the standard protocol during bolus administration of intravenous contrast. Multiplanar CT image reconstructions and MIPs were obtained to evaluate the  vascular anatomy. RADIATION DOSE REDUCTION: This exam was performed according to the departmental dose-optimization program which includes automated exposure control, adjustment of the mA and/or kV according to patient size and/or use of iterative reconstruction technique. CONTRAST:  75mL OMNIPAQUE IOHEXOL 350 MG/ML SOLN COMPARISON:  08/25/2022 FINDINGS: Cardiovascular: The heart size is normal. No substantial pericardial effusion. No thoracic aortic aneurysm. No substantial atherosclerosis of the thoracic aorta. Enlargement of the pulmonary outflow tract/main pulmonary arteries suggests pulmonary arterial hypertension. There is no filling defect within the opacified pulmonary arteries to suggest the presence of an acute pulmonary embolus. Mediastinum/Nodes: No mediastinal lymphadenopathy. There is no hilar lymphadenopathy. Irregular circumferential wall thickening is noted in the distal esophagus (see image 89/4). There is no axillary lymphadenopathy. Lungs/Pleura: No suspicious pulmonary nodule or mass. No focal airspace consolidation. There is no evidence of pleural effusion. Upper Abdomen: The liver shows diffusely decreased attenuation suggesting fat deposition. Musculoskeletal: No worrisome lytic or sclerotic osseous abnormality. Review of the MIP images confirms the above findings. IMPRESSION: 1. No CT evidence for acute pulmonary embolus. 2. Enlargement of the pulmonary outflow tract/main pulmonary arteries suggests pulmonary arterial hypertension. 3. Fairly marked irregular circumferential wall thickening in the distal esophagus. This may be related to esophagitis, but follow-up short-term upper endoscopy recommended to further evaluate as neoplasm cannot be excluded. 4. Hepatic steatosis.  Electronically Signed   By: Kennith Center M.D.   On: 07/03/2023 05:08   DG Chest 1 View Result Date: 07/03/2023 CLINICAL DATA:  Chest pain radiating to the back. EXAM: CHEST  1 VIEW COMPARISON:  06/09/2023 FINDINGS: The heart size and mediastinal contours are within normal limits. Both lungs are clear. The visualized skeletal structures are unremarkable. IMPRESSION: No active disease. Electronically Signed   By: Minerva Fester M.D.   On: 07/03/2023 02:40     PROCEDURES:  Critical Care performed: No      .1-3 Lead EKG Interpretation  Performed by: Brittannie Tawney, Layla Maw, DO Authorized by: Efstathios Sawin, Layla Maw, DO     Interpretation: abnormal     ECG rate:  120   ECG rate assessment: tachycardic     Rhythm: sinus tachycardia     Ectopy: none     Conduction: normal       IMPRESSION / MDM / ASSESSMENT AND PLAN / ED COURSE  I reviewed the triage vital signs and the nursing notes.    Patient here with chest pain, shortness of breath, vomiting.  Also reports 1 month of nonproductive cough and hoarse voice.  Appears intoxicated.  The patient is on the cardiac monitor to evaluate for evidence of arrhythmia and/or significant heart rate changes.   DIFFERENTIAL DIAGNOSIS (includes but not limited to):   GERD, gastritis, esophagitis, esophageal spasm, ACS, PE, pneumonia, viral URI   Patient's presentation is most consistent with acute presentation with potential threat to life or bodily function.   PLAN: Will obtain cardiac labs, D-dimer.  Will give GI cocktail, Protonix, saline.  Initial EKG shows prolonged QT interval but there is significant artifact due to patient movement.  Will recheck.  Will check electrolytes.   MEDICATIONS GIVEN IN ED: Medications  sodium chloride 0.9 % bolus 1,000 mL (0 mLs Intravenous Stopped 07/03/23 0312)  alum & mag hydroxide-simeth (MAALOX/MYLANTA) 200-200-20 MG/5ML suspension 30 mL (30 mLs Oral Given 07/03/23 0225)  pantoprazole (PROTONIX) EC tablet 40 mg  (40 mg Oral Given 07/03/23 0225)  LORazepam (ATIVAN) injection 1 mg (1 mg Intravenous Given 07/03/23 0237)  thiamine (VITAMIN B1) injection 100 mg (  100 mg Intravenous Given 07/03/23 0247)  sodium chloride 0.9 % bolus 1,000 mL (0 mLs Intravenous Stopped 07/03/23 0448)  iohexol (OMNIPAQUE) 350 MG/ML injection 75 mL (75 mLs Intravenous Contrast Given 07/03/23 0437)     ED COURSE: Patient's hemoglobin is normal.  She is hyperglycemic.  She does have a metabolic acidosis which could be from DKA but also could be from alcoholic ketoacidosis.  Will obtain VBG, beta hydroxybutyric acid level, urinalysis.  Will give second liter of IV fluids.  She reports she has not taken her metformin for the past 2 days.  Patient's D-dimer has come back elevated.  Will obtain CTA of her chest.  Is now complaining of right leg pain.  Will obtain ultrasound to rule out DVT given her reported history.  Will give Ativan for agitation, nausea and vomiting given prolonged QT interval.  Will also give IV thiamine.  3:45 AM  Pt's repeat EKG shows heart rate of 108, no ischemia, QTc 469 ms.  VBG shows normal bicarb, pH.  6:30 AM  Pt's urine does show some ketones.  Suspect elevated anion gap metabolic acidosis likely secondary to alcoholic ketoacidosis.  She is getting IV hydration here.  Beta hydroxybutyric acid level pending.  CTA of her chest, ultrasound of the right lower extremity showed no PE or DVT.  She does have signs of esophagitis but esophageal neoplasm cannot be ruled out.  Will have her follow-up with GI as an outpatient and start her on daily Protonix.  Patient has been able to tolerate p.o. here and is ambulatory.  Anticipate discharge home with sober ride if additional labs are still pending are unremarkable.  7:40 AM  Pt requesting discharge home.  Additional blood work is still pending.  Patient states that her ride is coming to get her.   CONSULTS: Pending further workup.  Patient requesting discharge home  prior to the rest of her lab work resulting.   OUTSIDE RECORDS REVIEWED: Reviewed patient's psychiatric evaluation yesterday for alcohol use disorder.       FINAL CLINICAL IMPRESSION(S) / ED DIAGNOSES   Final diagnoses:  Nonspecific chest pain  Alcohol use disorder  Hepatic steatosis     Rx / DC Orders   ED Discharge Orders          Ordered    pantoprazole (PROTONIX) 40 MG tablet  Daily        07/03/23 0522    ondansetron (ZOFRAN-ODT) 4 MG disintegrating tablet  Every 6 hours PRN        07/03/23 0522    thiamine (VITAMIN B1) 100 MG tablet  Daily        07/03/23 0713    folic acid (FOLVITE) 1 MG tablet  Daily        07/03/23 0713    metFORMIN (GLUCOPHAGE) 500 MG tablet  2 times daily with meals        07/03/23 0715             Note:  This document was prepared using Dragon voice recognition software and may include unintentional dictation errors.   Aariel Ems, Layla Maw, DO 07/03/23 0715    Asyria Kolander, Layla Maw, DO 07/03/23 [phone]

## 2023-07-03 NOTE — ED Notes (Signed)
 Po fluids provided.

## 2023-07-03 NOTE — ED Notes (Addendum)
This pt at nurse desk, asking how long it was going to be. Explained to pt that there are some results still pending and urine sample was just sent. Providers will come back when all the results are complete. Pt verbalized understanding at this time.   Pt requesting ice chips.

## 2023-07-03 NOTE — ED Notes (Addendum)
Patient ambulatory to restroom at this time. No issues ambulating. Patient asking to leave. MD Ward made aware

## 2023-07-03 NOTE — ED Notes (Signed)
Patient asked for cup of ice before leaving. Cup of ice given to patient

## 2023-07-03 NOTE — ED Notes (Signed)
Pt refuses to leave pulse ox and blood pressure cuff in place

## 2023-07-03 NOTE — ED Notes (Signed)
Pt states she needs to use restroom. Provided with cup for urine sample. Pt declines to recheck temperature at this time.

## 2023-07-16 ENCOUNTER — Other Ambulatory Visit: Payer: Self-pay

## 2023-07-16 ENCOUNTER — Emergency Department
Admission: EM | Admit: 2023-07-16 | Discharge: 2023-07-17 | Disposition: A | Discharge status: Other Acute Inpatient Hospital | Payer: Medicaid Other | Attending: Emergency Medicine | Admitting: Emergency Medicine

## 2023-07-16 DIAGNOSIS — R45851 Suicidal ideations: Secondary | ICD-10-CM | POA: Insufficient documentation

## 2023-07-16 DIAGNOSIS — Y908 Blood alcohol level of 240 mg/100 ml or more: Secondary | ICD-10-CM | POA: Insufficient documentation

## 2023-07-16 DIAGNOSIS — F1020 Alcohol dependence, uncomplicated: Secondary | ICD-10-CM | POA: Insufficient documentation

## 2023-07-16 DIAGNOSIS — F1094 Alcohol use, unspecified with alcohol-induced mood disorder: Secondary | ICD-10-CM | POA: Diagnosis not present

## 2023-07-16 DIAGNOSIS — R440 Auditory hallucinations: Secondary | ICD-10-CM | POA: Insufficient documentation

## 2023-07-16 DIAGNOSIS — F4323 Adjustment disorder with mixed anxiety and depressed mood: Secondary | ICD-10-CM | POA: Insufficient documentation

## 2023-07-16 DIAGNOSIS — F10929 Alcohol use, unspecified with intoxication, unspecified: Secondary | ICD-10-CM | POA: Insufficient documentation

## 2023-07-16 DIAGNOSIS — E119 Type 2 diabetes mellitus without complications: Secondary | ICD-10-CM | POA: Insufficient documentation

## 2023-07-16 LAB — URINE DRUG SCREEN, QUALITATIVE (ARMC ONLY)
Amphetamines, Ur Screen: NOT DETECTED
Barbiturates, Ur Screen: NOT DETECTED
Benzodiazepine, Ur Scrn: NOT DETECTED
Cannabinoid 50 Ng, Ur ~~LOC~~: NOT DETECTED
Cocaine Metabolite,Ur ~~LOC~~: NOT DETECTED
MDMA (Ecstasy)Ur Screen: NOT DETECTED
Methadone Scn, Ur: NOT DETECTED
Opiate, Ur Screen: NOT DETECTED
Phencyclidine (PCP) Ur S: NOT DETECTED
Tricyclic, Ur Screen: NOT DETECTED

## 2023-07-16 LAB — COMPREHENSIVE METABOLIC PANEL
ALT: 30 U/L (ref 0–44)
AST: 27 U/L (ref 15–41)
Albumin: 3.9 g/dL (ref 3.5–5.0)
Alkaline Phosphatase: 113 U/L (ref 38–126)
Anion gap: 16 — ABNORMAL HIGH (ref 5–15)
BUN: <5 mg/dL — ABNORMAL LOW (ref 6–20)
CO2: 25 mmol/L (ref 22–32)
Calcium: 8.6 mg/dL — ABNORMAL LOW (ref 8.9–10.3)
Chloride: 99 mmol/L (ref 98–111)
Creatinine, Ser: 0.61 mg/dL (ref 0.44–1.00)
GFR, Estimated: >60 mL/min (ref >60)
Glucose, Bld: 286 mg/dL — ABNORMAL HIGH (ref 70–99)
Potassium: 3.2 mmol/L — ABNORMAL LOW (ref 3.5–5.1)
Sodium: 140 mmol/L (ref 135–145)
Total Bilirubin: 0.7 mg/dL (ref 0.0–1.2)
Total Protein: 7.3 g/dL (ref 6.5–8.1)

## 2023-07-16 LAB — ETHANOL: Alcohol, Ethyl (B): 363 mg/dL (ref <10)

## 2023-07-16 LAB — CBC
HCT: 38.5 % (ref 36.0–46.0)
Hemoglobin: 13.5 g/dL (ref 12.0–15.0)
MCH: 34.5 pg — ABNORMAL HIGH (ref 26.0–34.0)
MCHC: 35.1 g/dL (ref 30.0–36.0)
MCV: 98.5 fL (ref 80.0–100.0)
Platelets: 364 10*3/uL (ref 150–400)
RBC: 3.91 MIL/uL (ref 3.87–5.11)
RDW: 18.5 % — ABNORMAL HIGH (ref 11.5–15.5)
WBC: 6.4 10*3/uL (ref 4.0–10.5)
nRBC: 0 % (ref 0.0–0.2)

## 2023-07-16 LAB — SALICYLATE LEVEL: Salicylate Lvl: <7 mg/dL — ABNORMAL LOW (ref 7.0–30.0)

## 2023-07-16 LAB — ACETAMINOPHEN LEVEL: Acetaminophen (Tylenol), Serum: <10 ug/mL — ABNORMAL LOW (ref 10–30)

## 2023-07-16 MED ORDER — ONDANSETRON HCL 4 MG PO TABS
4.0000 mg | ORAL_TABLET | Freq: Three times a day (TID) | ORAL | Status: DC | PRN
  Administered 2023-07-17: 4 mg via ORAL
  Filled 2023-07-16: qty 1

## 2023-07-16 MED ORDER — LORAZEPAM 2 MG PO TABS
4.0000 mg | ORAL_TABLET | Freq: Once | ORAL | Status: AC
Start: 2023-07-16 — End: 2023-07-16
  Administered 2023-07-16: 4 mg via ORAL
  Filled 2023-07-16: qty 2

## 2023-07-16 MED ORDER — HALOPERIDOL 5 MG PO TABS
5.0000 mg | ORAL_TABLET | ORAL | Status: AC
  Administered 2023-07-16: 5 mg via ORAL

## 2023-07-16 MED ORDER — ZIPRASIDONE MESYLATE 20 MG IM SOLR
20.0000 mg | Freq: Once | INTRAMUSCULAR | Status: AC
  Administered 2023-07-16: 20 mg via INTRAMUSCULAR
  Filled 2023-07-16: qty 20

## 2023-07-16 MED ORDER — ALUM & MAG HYDROXIDE-SIMETH 200-200-20 MG/5ML PO SUSP: 30.0000 mL | Freq: Four times a day (QID) | ORAL | Status: DC | PRN

## 2023-07-16 MED ORDER — IBUPROFEN 600 MG PO TABS: 600.0000 mg | ORAL_TABLET | Freq: Three times a day (TID) | ORAL | Status: DC | PRN
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
## 2023-07-16 NOTE — ED Provider Notes (Signed)
 [facility] Provider Note    Event Date/Time   First MD Initiated Contact with Patient 07/16/23 1656     (approximate)   History   Chief Complaint: Suicidal   HPI  Angela Landry is a 43 y.o. female brought to the ED under involuntary commitment due to alcohol intoxication and stated suicidal ideation.  Patient reports to me that she has been having auditory hallucinations and paranoia for the past few months and she abuses alcohol to try and cope with this.     Physical Exam   Triage Vital Signs: ED Triage Vitals  Encounter Vitals Group     BP 07/16/23 1634 (!) 136/104     Systolic BP Percentile --      Diastolic BP Percentile --      Pulse Rate 07/16/23 1634 (!) 117     Resp 07/16/23 1634 (!) 24     Temp 07/16/23 1634 99 F (37.2 C)     Temp Source 07/16/23 1634 Oral     SpO2 07/16/23 1634 100 %     Weight --      Height --      Head Circumference --      Peak Flow --      Pain Score 07/16/23 1637 0     Pain Loc --      Pain Education --      Exclude from Growth Chart --     Most recent vital signs: Vitals:   07/16/23 1943 07/16/23 2015  BP:    Pulse: (!) 108 99  Resp:    Temp:    SpO2: 99% 100%    General: Awake, no distress. CV:  Good peripheral perfusion.  Resp:  Normal effort.  Abd:  No distention.  Other:  No wounds   ED Results / Procedures / Treatments   Labs (all labs ordered are listed, but only abnormal results are displayed) Labs Reviewed  COMPREHENSIVE METABOLIC PANEL - Abnormal; Notable for the following components:      Result Value   Potassium 3.2 (*)    Glucose, Bld 286 (*)    BUN <5 (*)    Calcium 8.6 (*)    Anion gap 16 (*)    All other components within normal limits  ETHANOL - Abnormal; Notable for the following components:   Alcohol, Ethyl (B) 363 (*)    All other components within normal limits  SALICYLATE LEVEL - Abnormal; Notable for the following components:   Salicylate Lvl <7.0 (*)     All other components within normal limits  ACETAMINOPHEN  LEVEL - Abnormal; Notable for the following components:   Acetaminophen  (Tylenol ), Serum <10 (*)    All other components within normal limits  CBC - Abnormal; Notable for the following components:   MCH 34.5 (*)    RDW 18.5 (*)    All other components within normal limits  URINE DRUG SCREEN, QUALITATIVE ([facility] ONLY)  POC URINE PREG, ED     EKG    RADIOLOGY    PROCEDURES:  Procedures   MEDICATIONS ORDERED IN ED: Medications  ibuprofen  (ADVIL ) tablet 600 mg (has no administration in time range)  ondansetron  (ZOFRAN ) tablet 4 mg (has no administration in time range)  alum & mag hydroxide-simeth (MAALOX/MYLANTA) 200-200-20 MG/5ML suspension 30 mL (has no administration in time range)  haloperidol  (HALDOL ) tablet 5 mg (5 mg Oral Given 07/16/23 1712)  LORazepam  (ATIVAN ) tablet 4 mg (4 mg Oral Given 07/16/23 1720)  ziprasidone  (GEODON )  injection 20 mg (20 mg Intramuscular Given 07/16/23 1823)     IMPRESSION / MDM / ASSESSMENT AND PLAN / ED COURSE  I reviewed the triage vital signs and the nursing notes.  Patient's presentation is most consistent with acute presentation with potential threat to life or bodily function.  Presents with alcohol intoxication.  Agreeable for medications to help with her symptoms.  Will continue IVC.  She is medically stable  The patient has been placed in psychiatric observation due to the need to provide a safe environment for the patient while obtaining psychiatric consultation and evaluation, as well as ongoing medical and medication management to treat the patient's condition.  The patient has been placed under full IVC at this time.      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   Viviann Pastor, MD 07/16/23 2130

## 2023-07-16 NOTE — ED Notes (Signed)
Patient removed oxygen from nose- patient maintaining 94% on room air at this time.

## 2023-07-16 NOTE — ED Notes (Signed)
Pt dressed out by this NT and RN Heather: Pt belongings: 1 black night gown 1 gold earing, infinity symbol 1 purple head band 1 pair of red fuzzy slippers.   Pt refused to remove daith piercing in ear.

## 2023-07-16 NOTE — ED Notes (Signed)
 IVC PENDING  CONSULT ?

## 2023-07-16 NOTE — ED Notes (Signed)
Patient placed back on 4L nasal cannula due to oxygen saturation dropping to 80% on room air.

## 2023-07-16 NOTE — ED Notes (Signed)
 Patient continues with loud tone of voice and demanding to leave. Patient directed by security back to room. Patient yells I just want to walk around. Patient labile and boisterous. Patient is not physically aggressive with staff, but does yell at random people I'll whoop your ass. Security continually having to stay at patient's door to prevent patient from walking out or leaving. Patient stated I'm about to raise hell when redirected by security. Patient is agreeable to take newly ordered medication. Reference MAR.

## 2023-07-16 NOTE — ED Notes (Signed)
 Patient placed on 4L nasal cannula at this time due to oxygen levels dropping to 88% on room air while sleeping. Patient placed on 2L initially and only came up to 90%, Patient is now 96% on 4L nasal cannula. Respirations remain unlabored- patient appears to have brief infrequent periods of apnea while sleeping as well lasting around 3 seconds. MD stafford aware- verbal order for continuous pulse ox. Patient remains on continuous pulse ox at this time.

## 2023-07-16 NOTE — BH Assessment (Signed)
Patient currently unable to participate in assessment at this time, patient has been medicated and BAL was 363 upon arrival, patient is currently sleeping. Psyc team to follow

## 2023-07-16 NOTE — ED Notes (Signed)
 Patient yelling I'm leaving let me out of here. Patient continues to walk past security. Patient appears intoxicated. Difficult to redirect at this time. PO haldol  ordered, patient agreeable to take. However, patient continues to state I'm still gonna get out of here

## 2023-07-16 NOTE — ED Notes (Signed)
Patient telling security at bedside "prepare to be here all night"

## 2023-07-16 NOTE — ED Notes (Signed)
 Patient repeatedly attempting to walk out of room and stated I'm going to raise hell if I don't get some medicine to sleep. Patient requiring multiple redirections by multiple staff members. Patient took IM injection willingly with no manual hold required.

## 2023-07-17 ENCOUNTER — Inpatient Hospital Stay
Admission: RE | Admit: 2023-07-17 | Discharge: 2023-07-24 | DRG: 885 | Disposition: A | Discharge status: Home / Self Care | Payer: Medicaid Other | Source: Intra-hospital | Attending: Psychiatry | Admitting: Psychiatry

## 2023-07-17 ENCOUNTER — Encounter: Payer: Self-pay | Admitting: Psychiatry

## 2023-07-17 DIAGNOSIS — Z7982 Long term (current) use of aspirin: Secondary | ICD-10-CM

## 2023-07-17 DIAGNOSIS — M797 Fibromyalgia: Secondary | ICD-10-CM | POA: Diagnosis present

## 2023-07-17 DIAGNOSIS — G40909 Epilepsy, unspecified, not intractable, without status epilepticus: Secondary | ICD-10-CM | POA: Diagnosis present

## 2023-07-17 DIAGNOSIS — F17210 Nicotine dependence, cigarettes, uncomplicated: Secondary | ICD-10-CM | POA: Diagnosis present

## 2023-07-17 DIAGNOSIS — Z5941 Food insecurity: Secondary | ICD-10-CM

## 2023-07-17 DIAGNOSIS — F322 Major depressive disorder, single episode, severe without psychotic features: Secondary | ICD-10-CM | POA: Diagnosis present

## 2023-07-17 DIAGNOSIS — Z7985 Long-term (current) use of injectable non-insulin antidiabetic drugs: Secondary | ICD-10-CM

## 2023-07-17 DIAGNOSIS — F1020 Alcohol dependence, uncomplicated: Secondary | ICD-10-CM | POA: Insufficient documentation

## 2023-07-17 DIAGNOSIS — R1314 Dysphagia, pharyngoesophageal phase: Secondary | ICD-10-CM | POA: Diagnosis present

## 2023-07-17 DIAGNOSIS — Z79890 Hormone replacement therapy (postmenopausal): Secondary | ICD-10-CM

## 2023-07-17 DIAGNOSIS — F10239 Alcohol dependence with withdrawal, unspecified: Secondary | ICD-10-CM | POA: Diagnosis present

## 2023-07-17 DIAGNOSIS — F4323 Adjustment disorder with mixed anxiety and depressed mood: Secondary | ICD-10-CM | POA: Diagnosis present

## 2023-07-17 DIAGNOSIS — Z79899 Other long term (current) drug therapy: Secondary | ICD-10-CM

## 2023-07-17 DIAGNOSIS — Z5982 Transportation insecurity: Secondary | ICD-10-CM

## 2023-07-17 DIAGNOSIS — F323 Major depressive disorder, single episode, severe with psychotic features: Principal | ICD-10-CM | POA: Diagnosis present

## 2023-07-17 DIAGNOSIS — I10 Essential (primary) hypertension: Secondary | ICD-10-CM | POA: Diagnosis present

## 2023-07-17 DIAGNOSIS — Z791 Long term (current) use of non-steroidal anti-inflammatories (NSAID): Secondary | ICD-10-CM

## 2023-07-17 DIAGNOSIS — E079 Disorder of thyroid, unspecified: Secondary | ICD-10-CM | POA: Diagnosis present

## 2023-07-17 DIAGNOSIS — R1319 Other dysphagia: Secondary | ICD-10-CM

## 2023-07-17 DIAGNOSIS — E1140 Type 2 diabetes mellitus with diabetic neuropathy, unspecified: Secondary | ICD-10-CM | POA: Diagnosis present

## 2023-07-17 DIAGNOSIS — R45851 Suicidal ideations: Secondary | ICD-10-CM | POA: Diagnosis present

## 2023-07-17 DIAGNOSIS — F411 Generalized anxiety disorder: Secondary | ICD-10-CM | POA: Diagnosis present

## 2023-07-17 DIAGNOSIS — Z7984 Long term (current) use of oral hypoglycemic drugs: Secondary | ICD-10-CM

## 2023-07-17 DIAGNOSIS — K222 Esophageal obstruction: Secondary | ICD-10-CM | POA: Diagnosis present

## 2023-07-17 DIAGNOSIS — K219 Gastro-esophageal reflux disease without esophagitis: Secondary | ICD-10-CM | POA: Diagnosis present

## 2023-07-17 LAB — GLUCOSE, CAPILLARY: Glucose-Capillary: 166 mg/dL — ABNORMAL HIGH (ref 70–99)

## 2023-07-17 LAB — PREGNANCY, URINE: Preg Test, Ur: NEGATIVE

## 2023-07-17 MED ORDER — ACETAMINOPHEN 325 MG PO TABS
650.0000 mg | ORAL_TABLET | Freq: Four times a day (QID) | ORAL | Status: DC | PRN
  Administered 2023-07-21 – 2023-07-24 (×8): 650 mg via ORAL
  Filled 2023-07-17 (×8): qty 2

## 2023-07-17 MED ORDER — ADULT MULTIVITAMIN W/MINERALS CH
1.0000 | ORAL_TABLET | Freq: Every day | ORAL | Status: DC
  Administered 2023-07-17 – 2023-07-24 (×8): 1 via ORAL
  Filled 2023-07-17 (×8): qty 1

## 2023-07-17 MED ORDER — LEVOTHYROXINE SODIUM 50 MCG PO TABS
50.0000 ug | ORAL_TABLET | Freq: Every day | ORAL | Status: DC
  Administered 2023-07-18 – 2023-07-24 (×6): 50 ug via ORAL
  Filled 2023-07-17 (×6): qty 1

## 2023-07-17 MED ORDER — HYDROXYZINE HCL 25 MG PO TABS: 25.0000 mg | ORAL_TABLET | Freq: Three times a day (TID) | ORAL | Status: DC | PRN

## 2023-07-17 MED ORDER — HALOPERIDOL LACTATE 5 MG/ML IJ SOLN
5.0000 mg | Freq: Three times a day (TID) | INTRAMUSCULAR | Status: DC | PRN
  Administered 2023-07-21: 5 mg via INTRAMUSCULAR
  Filled 2023-07-17: qty 1

## 2023-07-17 MED ORDER — IBUPROFEN 600 MG PO TABS
600.0000 mg | ORAL_TABLET | Freq: Three times a day (TID) | ORAL | Status: DC | PRN
  Administered 2023-07-21: 600 mg via ORAL
  Filled 2023-07-17: qty 1

## 2023-07-17 MED ORDER — CHLORDIAZEPOXIDE HCL 25 MG PO CAPS
25.0000 mg | ORAL_CAPSULE | Freq: Four times a day (QID) | ORAL | Status: AC | PRN
  Administered 2023-07-19 (×2): 25 mg via ORAL
  Filled 2023-07-17 (×2): qty 1

## 2023-07-17 MED ORDER — FLUOXETINE HCL 10 MG PO CAPS
10.0000 mg | ORAL_CAPSULE | Freq: Every day | ORAL | Status: DC
  Administered 2023-07-17: 10 mg via ORAL
  Filled 2023-07-17: qty 1

## 2023-07-17 MED ORDER — CHLORDIAZEPOXIDE HCL 25 MG PO CAPS
25.0000 mg | ORAL_CAPSULE | Freq: Four times a day (QID) | ORAL | Status: AC
  Administered 2023-07-17 – 2023-07-18 (×4): 25 mg via ORAL
  Filled 2023-07-17 (×4): qty 1

## 2023-07-17 MED ORDER — NICOTINE 21 MG/24HR TD PT24
21.0000 mg | MEDICATED_PATCH | Freq: Every day | TRANSDERMAL | Status: DC
  Administered 2023-07-18 – 2023-07-24 (×7): 21 mg via TRANSDERMAL
  Filled 2023-07-17 (×7): qty 1

## 2023-07-17 MED ORDER — CHLORDIAZEPOXIDE HCL 25 MG PO CAPS
25.0000 mg | ORAL_CAPSULE | Freq: Every day | ORAL | Status: AC
  Administered 2023-07-21: 25 mg via ORAL
  Filled 2023-07-17: qty 1

## 2023-07-17 MED ORDER — THIAMINE MONONITRATE 100 MG PO TABS
100.0000 mg | ORAL_TABLET | Freq: Every day | ORAL | Status: DC
  Administered 2023-07-18 – 2023-07-24 (×7): 100 mg via ORAL
  Filled 2023-07-17 (×7): qty 1

## 2023-07-17 MED ORDER — LORAZEPAM 2 MG/ML IJ SOLN: 0.0000 mg | Freq: Two times a day (BID) | INTRAMUSCULAR | Status: DC

## 2023-07-17 MED ORDER — PANTOPRAZOLE SODIUM 40 MG PO TBEC
40.0000 mg | DELAYED_RELEASE_TABLET | Freq: Every day | ORAL | Status: DC
Start: 2023-07-18 — End: 2023-07-21
  Administered 2023-07-18 – 2023-07-21 (×4): 40 mg via ORAL
  Filled 2023-07-17 (×4): qty 1

## 2023-07-17 MED ORDER — MAGNESIUM HYDROXIDE 400 MG/5ML PO SUSP: 30.0000 mL | Freq: Every day | ORAL | Status: DC | PRN

## 2023-07-17 MED ORDER — THIAMINE HCL 100 MG/ML IJ SOLN: 100.0000 mg | Freq: Every day | INTRAMUSCULAR | Status: DC

## 2023-07-17 MED ORDER — LORAZEPAM 2 MG/ML IJ SOLN: 0.0000 mg | Freq: Four times a day (QID) | INTRAMUSCULAR | Status: DC

## 2023-07-17 MED ORDER — LOPERAMIDE HCL 2 MG PO CAPS: 2.0000 mg | ORAL_CAPSULE | ORAL | Status: AC | PRN

## 2023-07-17 MED ORDER — GABAPENTIN 400 MG PO CAPS
400.0000 mg | ORAL_CAPSULE | Freq: Every day | ORAL | Status: DC
Start: 2023-07-17 — End: 2023-07-24
  Administered 2023-07-17 – 2023-07-23 (×7): 400 mg via ORAL
  Filled 2023-07-17 (×7): qty 1

## 2023-07-17 MED ORDER — TRAZODONE HCL 50 MG PO TABS
150.0000 mg | ORAL_TABLET | Freq: Every day | ORAL | Status: DC
Start: 2023-07-17 — End: 2023-07-24
  Administered 2023-07-17 – 2023-07-23 (×7): 150 mg via ORAL
  Filled 2023-07-17 (×7): qty 1

## 2023-07-17 MED ORDER — CHLORDIAZEPOXIDE HCL 25 MG PO CAPS
25.0000 mg | ORAL_CAPSULE | Freq: Three times a day (TID) | ORAL | Status: AC
  Administered 2023-07-19 (×3): 25 mg via ORAL
  Filled 2023-07-17 (×3): qty 1

## 2023-07-17 MED ORDER — ONDANSETRON HCL 4 MG PO TABS
4.0000 mg | ORAL_TABLET | Freq: Three times a day (TID) | ORAL | Status: DC | PRN
  Administered 2023-07-17: 4 mg via ORAL
  Filled 2023-07-17 (×2): qty 1

## 2023-07-17 MED ORDER — THIAMINE MONONITRATE 100 MG PO TABS: 100.0000 mg | ORAL_TABLET | Freq: Every day | ORAL | Status: DC

## 2023-07-17 MED ORDER — LORAZEPAM 2 MG PO TABS
0.0000 mg | ORAL_TABLET | Freq: Four times a day (QID) | ORAL | Status: DC
  Administered 2023-07-17 (×2): 1 mg via ORAL
  Filled 2023-07-17 (×2): qty 1

## 2023-07-17 MED ORDER — CHLORDIAZEPOXIDE HCL 25 MG PO CAPS
25.0000 mg | ORAL_CAPSULE | ORAL | Status: AC
  Administered 2023-07-20 (×2): 25 mg via ORAL
  Filled 2023-07-17 (×2): qty 1

## 2023-07-17 MED ORDER — ONDANSETRON 4 MG PO TBDP
4.0000 mg | ORAL_TABLET | Freq: Four times a day (QID) | ORAL | Status: AC | PRN
  Administered 2023-07-18 – 2023-07-20 (×5): 4 mg via ORAL
  Filled 2023-07-17 (×5): qty 1

## 2023-07-17 MED ORDER — ALUM & MAG HYDROXIDE-SIMETH 200-200-20 MG/5ML PO SUSP: 30.0000 mL | ORAL | Status: DC | PRN

## 2023-07-17 MED ORDER — DIPHENHYDRAMINE HCL 50 MG/ML IJ SOLN
50.0000 mg | Freq: Three times a day (TID) | INTRAMUSCULAR | Status: DC | PRN
  Administered 2023-07-21: 50 mg via INTRAMUSCULAR
  Filled 2023-07-17: qty 1

## 2023-07-17 MED ORDER — LORAZEPAM 2 MG PO TABS: 0.0000 mg | ORAL_TABLET | Freq: Two times a day (BID) | ORAL | Status: DC

## 2023-07-17 MED ORDER — METOPROLOL SUCCINATE ER 25 MG PO TB24
25.0000 mg | ORAL_TABLET | Freq: Every day | ORAL | Status: DC
Start: 2023-07-18 — End: 2023-07-24
  Administered 2023-07-18 – 2023-07-24 (×7): 25 mg via ORAL
  Filled 2023-07-17 (×7): qty 1

## 2023-07-17 MED ORDER — THIAMINE MONONITRATE 100 MG PO TABS
100.0000 mg | ORAL_TABLET | Freq: Every day | ORAL | Status: DC
  Administered 2023-07-17: 100 mg via ORAL
  Filled 2023-07-17: qty 1

## 2023-07-17 MED ORDER — CHLORDIAZEPOXIDE HCL 25 MG PO CAPS
25.0000 mg | ORAL_CAPSULE | Freq: Once | ORAL | Status: AC
  Administered 2023-07-17: 25 mg via ORAL
  Filled 2023-07-17: qty 1

## 2023-07-17 MED ORDER — LORAZEPAM 2 MG PO TABS: 0.0000 mg | ORAL_TABLET | Freq: Four times a day (QID) | ORAL | Status: DC

## 2023-07-17 MED ORDER — DULOXETINE HCL 30 MG PO CPEP
60.0000 mg | ORAL_CAPSULE | Freq: Every day | ORAL | Status: DC
Start: 2023-07-17 — End: 2023-07-24
  Administered 2023-07-17 – 2023-07-23 (×7): 60 mg via ORAL
  Filled 2023-07-17 (×7): qty 2

## 2023-07-17 MED ORDER — LORAZEPAM 2 MG/ML IJ SOLN
2.0000 mg | Freq: Three times a day (TID) | INTRAMUSCULAR | Status: DC | PRN
  Administered 2023-07-21: 2 mg via INTRAMUSCULAR
  Filled 2023-07-17: qty 1

## 2023-07-17 MED ORDER — METFORMIN HCL 500 MG PO TABS
500.0000 mg | ORAL_TABLET | Freq: Two times a day (BID) | ORAL | Status: DC
  Administered 2023-07-18 – 2023-07-21 (×8): 500 mg via ORAL
  Filled 2023-07-17 (×8): qty 1
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
## 2023-07-17 NOTE — Group Note (Signed)
 Date:  07/17/2023 Time:  9:34 PM  Group Topic/Focus:  Wrap-Up Group:   The focus of this group is to help patients review their daily goal of treatment and discuss progress on daily workbooks.    Participation Level:  Active  Participation Quality:  Appropriate, Attentive, and Sharing  Affect:  Appropriate  Cognitive:  Appropriate  Insight: Appropriate and Good  Engagement in Group:  Engaged  Modes of Intervention:  Discussion  Additional Comments:     Kerri Katz 07/17/2023, 9:34 PM

## 2023-07-17 NOTE — ED Notes (Signed)
 Transferred by [facility] via wheelchair by EDT and security. 1 bag of belongings sent to be locked in [place] locker room. Pt alert and oriented X4, cooperative, RR even and unlabored, color WNL. Pt in NAD.

## 2023-07-17 NOTE — Plan of Care (Signed)
  Problem: Education: Goal: Mental status will improve Outcome: Progressing Goal: Verbalization of understanding the information provided will improve Outcome: Progressing   Problem: Coping: Goal: Ability to verbalize frustrations and anger appropriately will improve Outcome: Progressing   Problem: Safety: Goal: Periods of time without injury will increase Outcome: Progressing

## 2023-07-17 NOTE — ED Provider Notes (Signed)
 Emergency Medicine Observation Re-evaluation Note  Angela Landry is a 43 y.o. female, seen on rounds today.  Pt initially presented to the ED for complaints of Suicidal Currently, the patient is resting, voices no medical complaints.  Physical Exam  BP (!) 136/104   Pulse (!) 113   Temp 99 F (37.2 C) (Oral)   Resp (!) 24   SpO2 99%  Physical Exam General: Resting in no acute distress Cardiac: No cyanosis Lungs: Equal rise and fall Psych: Not agitated  ED Course / MDM  EKG:   I have reviewed the labs performed to date as well as medications administered while in observation.  Recent changes in the last 24 hours include no events overnight.  Plan  Current plan is for psychiatric disposition.    Montrice Gracey J, MD 07/17/23 [phone]

## 2023-07-17 NOTE — Plan of Care (Signed)
 New admission.  Problem: Education: Goal: Knowledge of [facility] Education information/materials will improve Outcome: Not Progressing Goal: Emotional status will improve Outcome: Not Progressing Goal: Mental status will improve Outcome: Not Progressing Goal: Verbalization of understanding the information provided will improve Outcome: Not Progressing   Problem: Activity: Goal: Interest or engagement in activities will improve Outcome: Not Progressing Goal: Sleeping patterns will improve Outcome: Not Progressing   Problem: Coping: Goal: Ability to verbalize frustrations and anger appropriately will improve Outcome: Not Progressing Goal: Ability to demonstrate self-control will improve Outcome: Not Progressing   Problem: Health Behavior/Discharge Planning: Goal: Identification of resources available to assist in meeting health care needs will improve Outcome: Not Progressing Goal: Compliance with treatment plan for underlying cause of condition will improve Outcome: Not Progressing   Problem: Physical Regulation: Goal: Ability to maintain clinical measurements within normal limits will improve Outcome: Not Progressing   Problem: Safety: Goal: Periods of time without injury will increase Outcome: Not Progressing   Problem: Coping: Goal: Ability to identify and develop effective coping behavior will improve Outcome: Not Progressing   Problem: Education: Goal: Knowledge of disease or condition will improve Outcome: Not Progressing   Problem: Physical Regulation: Goal: Complications related to the disease process, condition or treatment will be avoided or minimized Outcome: Not Progressing

## 2023-07-17 NOTE — ED Notes (Signed)
Pt requested shower; provided clean hospital clothing and linens.  Shower setup provided with soap, shampoo, toothbrush/toothpaste, and deodorant.  Pt able to preform own ADL's with no assistance.    

## 2023-07-17 NOTE — Tx Team (Signed)
 Initial Treatment Plan 07/17/2023 8:24 PM [name] FMW:[id number]    PATIENT STRESSORS: Financial difficulties   Legal issue   Marital or family conflict   Medication change or noncompliance   Substance abuse     PATIENT STRENGTHS: Ability for insight  Communication skills  General fund of knowledge  Motivation for treatment/growth  Supportive family/friends    PATIENT IDENTIFIED PROBLEMS: Alcohol abuse  Anxiety  Medication noncompliance, due to not having psych services  Probation               DISCHARGE CRITERIA:  Ability to meet basic life and health needs Improved stabilization in mood, thinking, and/or behavior Need for constant or close observation no longer present Reduction of life-threatening or endangering symptoms to within safe limits Withdrawal symptoms are absent or subacute and managed without 24-hour nursing intervention  PRELIMINARY DISCHARGE PLAN: Attend 12-step recovery group Outpatient therapy Return to previous living arrangement Return to previous work or school arrangements  PATIENT/FAMILY INVOLVEMENT: This treatment plan has been presented to and reviewed with the patient, Angela Landry. The patient has been given the opportunity to ask questions and make suggestions.  Elfreida Heggs, RN 07/17/2023, 8:24 PM

## 2023-07-17 NOTE — ED Notes (Signed)
N. Ray notified of pt CIWA score via secure chat. TTS at bedside doing assessment currently.

## 2023-07-17 NOTE — ED Notes (Signed)
 Breakfast tray given.

## 2023-07-17 NOTE — ED Notes (Signed)
 IVC/Still Pending Consult
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
## 2023-07-17 NOTE — ED Notes (Signed)
 Hospital meal provided.  100% consumed, pt tolerated w/o complaints.  Waste discarded appropriately.

## 2023-07-17 NOTE — ED Notes (Signed)
 Hospital meal provided, pt tolerated w/o complaints.  Waste discarded appropriately.

## 2023-07-17 NOTE — Plan of Care (Signed)
?  Problem: Education: ?Goal: Mental status will improve ?Outcome: Progressing ?Goal: Verbalization of understanding the information provided will improve ?Outcome: Progressing ?  ?Problem: Coping: ?Goal: Ability to verbalize frustrations and anger appropriately will improve ?Outcome: Progressing ?  ?

## 2023-07-17 NOTE — ED Notes (Signed)
 Pt. Transferred to BHU , room# 2 from main ED .Patient was screened by security before entering the unit. Received Report and Recommendations from [place], [place] . Pt. Oriented to unit including Q15 minute rounding as well as locked bathroom protocol, meal / snack schedule, and  the security cameras in place for their protection. Patient is A/O x 3, warm / dry.  Showing no acute signs of distress. Staff to monitor as ordered

## 2023-07-17 NOTE — Consult Note (Signed)
 [facility] Psychiatric Consult Initial  Patient Name: .Angela Landry  MRN: [id number]  DOB: 1980/07/05  Consult Order details:  Orders (From admission, onward)     Start     Ordered   07/16/23 1657  CONSULT TO CALL ACT TEAM       Ordering Provider: Viviann Pastor, MD  Provider:  (Not yet assigned)  Question:  Reason for Consult?  Answer:  Psych consult   07/16/23 1656   07/16/23 1657  IP CONSULT TO PSYCHIATRY       Ordering Provider: Viviann Pastor, MD  Provider:  (Not yet assigned)  Question Answer Comment  Consult Timeframe URGENT - requires response within 12 hours   URGENT timeframe requires provider to provider communication, has the provider to provider communication been completed Yes   Reason for Consult? Consult for medication management   Contact phone number where the requesting provider can be reached [phone]      07/16/23 1656             Mode of Visit: In person, 45 min was spent on this consult.    Psychiatry Consult Evaluation  Service Date: July 17, 2023 LOS:  LOS: 0 days  Chief Complaint I have a lot going on in my life   Primary Psychiatric Diagnoses  Adjustment disorder with mixed anxiety and depressed mood 2.  Suicidal ideation 3.  Alcohol use disorder, severe, dependence  Assessment  Angela Landry is a 43 y.o. female admitted: Presented to the EDfor 07/16/2023  4:52 PM for verbalizing suicidal thoughts and feeling depressed. She carries the psychiatric diagnoses of adjustment disorder with mixed anxiety and depressed mood, suicidal ideation, alcohol use disorder and has a past medical history of hypertension, GERD, diabetes, alcohol use disorder, and hyperlipidemia.   Her current presentation of depression and suicidal thoughts is most consistent with patient report, nursing notes, and patient interview. She meets criteria for adjustment disorder based on current life stressors, statements of coping with alcohol.  Current outpatient  psychotropic medications include gabapentin  and historically she has had a positive response to these medications. She was  compliant with medications prior to admission as evidenced by patient report. On initial examination, patient is tearful and withdrawn but participating in interview. Please see plan below for detailed recommendations.   Diagnoses:  Active Hospital problems: Active Problems:   Suicidal ideation   Alcohol use disorder, severe, dependence (HCC)   Adjustment disorder with mixed anxiety and depressed mood    Plan   ## Psychiatric Medication Recommendations:  -Start fluoxetine  10 mg daily by mouth -Start hydroxyzine  25 mg 3 times daily by mouth as needed for anxiety  ## Medical Decision Making Capacity: Not specifically addressed in this encounter  ## Further Work-up:  -- EKG will review when completed.  -- Pertinent labwork reviewed earlier this admission includes: CBC, CMP, UDS, alcohol level, drug toxicity levels.   ## Disposition:-- We recommend inpatient psychiatric hospitalization when medically cleared. Patient is under voluntary admission status at this time; please IVC if attempts to leave hospital.  ## Behavioral / Environmental: -Utilize compassion and acknowledge the patient's experiences while setting clear and realistic expectations for care.    ## Safety and Observation Level:  - Based on my clinical evaluation, I estimate the patient to be at low risk of self harm in the current setting. - At this time, we recommend  routine. This decision is based on my review of the chart including patient's history and current presentation, interview of the patient,  mental status examination, and consideration of suicide risk including evaluating suicidal ideation, plan, intent, suicidal or self-harm behaviors, risk factors, and protective factors. This judgment is based on our ability to directly address suicide risk, implement suicide prevention strategies, and  develop a safety plan while the patient is in the clinical setting. Please contact our team if there is a concern that risk level has changed.  CSSR Risk Category:C-SSRS RISK CATEGORY: Low Risk  Suicide Risk Assessment: Patient has following modifiable risk factors for suicide: untreated depression, which we are addressing by recommendation for inpatient admission. Patient has following non-modifiable or demographic risk factors for suicide: psychiatric hospitalization Patient has the following protective factors against suicide: Supportive family  Thank you for this consult request. Recommendations have been communicated to the primary team.  We will recommend for inpatient admission at this time.   Dorn Jama Der, NP       History of Present Illness  Relevant Aspects of Hospital ED Course:  Admitted on 07/16/2023 for this verbalize suicide thoughts and depressive mood. They are currently sitting up in bed tearful and withdrawn participating in interview.   Patient Report:  43 year old female presenting to the emergency department at [facility] for being intoxicated on alcohol on 07/16/2023 with verbalized suicidal thoughts shared with medical staff.  Patient reports that this episode started 4 days ago stating that she became very depressed when she found out that she her house is getting foreclosed on, going to jail which involves several DWIs which the judge ordered her to go to jail for weekends for 10 days, while losing her voice and not finding enough work.  Patient endorses anxiety and depressive symptoms, stating that she has had multiple psychiatric inpatient visits as well as trying to establish a psychiatrist outside with no success and sees a therapist Ms. Laney weekly.  Medical problems include hypertension, diabetes, fibromyalgia and hypothyroid.  According to mother who is collateral Angela Landry states that the patient has been having since a very significant tough time at home stating  that drinking is her only solution in which she becomes very violent.  There are reports with the ambulance service in which she becomes violent when she calls EMS to come pick her up and attempts to fight with first responders.  This seems to be a chronic issue in which she uses drinking to cope with her stress and has further threatened the whole family including mother father and siblings to take them out which prompted an IVC to be drawn out by Lauraine Dales her mother.  Family does not feel safe with her at home and they are afraid that she is going to harm herself or someone else.  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  Due to the safety concerns regarding homicidal threats towards her whole family as well as suicidal thoughts verbalized to medical staff it is recommended for patient to be placed in inpatient admission for safety and medication management.  For depressive thoughts patient was started on fluoxetine  10 mg daily by mouth as well as hydroxyzine  25 mg by mouth 3 times daily as needed for anxiety.   Psych ROS:  Depression: Endorses Anxiety: Endorses Mania (lifetime and current): Denies Psychosis: (lifetime and current): Denies  Collateral information:  Contacted Angela Landry, mother at [phone] on 07/17/2023  Review of Systems  Constitutional: Negative.   HENT: Negative.    Eyes: Negative.   Respiratory:  Negative.    Cardiovascular: Negative.   Gastrointestinal: Negative.   Genitourinary: Negative.   Musculoskeletal:  Positive for myalgias.  Skin: Negative.   Neurological: Negative.   Endo/Heme/Allergies: Negative.   Psychiatric/Behavioral:  Positive for depression and suicidal ideas. The patient is nervous/anxious.      Psychiatric and Social History  Psychiatric History:  Information collected from patient  Current Psych Provider: [facility], [facility] to get an  appointment Home Meds (current): Gabapentin  Previous Med Trials: States no previous med trials Therapy: Currently seeing Ms. Laney.  Prior Psych Hospitalization: Multiple psychiatric hospitalizations here at [facility] and other facilities within the last year.  All related to the use of alcohol and actions while drunk. Prior Self Harm: Denies Prior Violence: Denies  Family Psych History: Denies Family Hx suicide: Denies  Social History:   Educational Hx: Air Traffic Controller Hx: Previous [employer], works clinical biochemist now [employer] Hx: Conservator, Museum/gallery, is a responsibility to show up for jail on weekends for charges Living Situation: Lives with a friend, home currently being foreclosed on Access to weapons/lethal means: Denies  Substance History Alcohol: Inconsistent but heavy use last use was 4 bottles of liquor and 1 sitting, with last drinking episode about a quarter of a bottle before presenting to the emergency department.  Tobacco: Most 1 pack/day Illicit drugs: Denies   Exam Findings   Vital Signs:  Temp:  [98.2 F (36.8 C)-99 F (37.2 C)] 98.2 F (36.8 C) (02/05 0825) Pulse Rate:  [99-129] 129 (02/05 0825) Resp:  [18-24] 18 (02/05 0825) BP: (136-167)/(104-109) 167/109 (02/05 0825) SpO2:  [93 %-100 %] 93 % (02/05 0825) Blood pressure (!) 167/109, pulse (!) 129, temperature 98.2 F (36.8 C), temperature source Oral, resp. rate 18, SpO2 93%. There is no height or weight on file to calculate BMI.  Physical Exam HENT:     Head: Normocephalic.     Mouth/Throat:     Mouth: Mucous membranes are moist.  Cardiovascular:     Rate and Rhythm: Normal rate.  Pulmonary:     Effort: Pulmonary effort is normal.  Musculoskeletal:     Cervical back: Normal range of motion.  Skin:    General: Skin is warm and dry.  Neurological:     Mental Status: She is alert.  Psychiatric:        Mood and Affect: Mood is anxious and depressed.        Behavior: Behavior is slowed and  withdrawn.        Thought Content: Thought content includes suicidal ideation.     Mental Status Exam: General Appearance: Disheveled  Orientation:  Full (Time, Place, and Person)  Memory:  Immediate;   Good Recent;   Good Remote;   Good  Concentration:  Concentration: Good and Attention Span: Good  Recall:  Good  Attention  Good  Eye Contact:  Good  Speech:  Slow  Language:  Good  Volume:  Normal  Mood: Depressed  Affect:  Appropriate  Thought Process:  Coherent  Thought Content:  Illogical  Suicidal Thoughts:  No  Homicidal Thoughts:  No  Judgement:  Poor  Insight:  Lacking  Psychomotor Activity:  Normal  Akathisia:  No  Fund of Knowledge:  Good      Assets:  Others:  none Identified  Cognition:  WNL  ADL's:  Intact  AIMS (if indicated):        Other History   These have been pulled in through the EMR, reviewed, and  updated if appropriate.  Family History:  The patient's family history is not on file.  Medical History: Past Medical History:  Diagnosis Date  . Diabetes mellitus without complication (HCC)   . Fibromyalgia   . GERD (gastroesophageal reflux disease)   . IBS (irritable bowel syndrome)   . Thyroid disease     Surgical History: No past surgical history on file.   Medications:   Current Facility-Administered Medications:  .  alum & mag hydroxide-simeth (MAALOX/MYLANTA) 200-200-20 MG/5ML suspension 30 mL, 30 mL, Oral, Q6H PRN, Viviann Pastor, MD .  ibuprofen  (ADVIL ) tablet 600 mg, 600 mg, Oral, Q8H PRN, Viviann Pastor, MD .  LORazepam  (ATIVAN ) injection 0-4 mg, 0-4 mg, Intravenous, Q6H **OR** LORazepam  (ATIVAN ) tablet 0-4 mg, 0-4 mg, Oral, Q6H, Ray, Neha, MD, 1 mg at 07/17/23 0846 .  [START ON 07/19/2023] LORazepam  (ATIVAN ) injection 0-4 mg, 0-4 mg, Intravenous, Q12H **OR** [START ON 07/19/2023] LORazepam  (ATIVAN ) tablet 0-4 mg, 0-4 mg, Oral, Q12H, Ray, Neha, MD .  ondansetron  (ZOFRAN ) tablet 4 mg, 4 mg, Oral, Q8H PRN, Viviann Pastor, MD,  4 mg at 07/17/23 0413 .  thiamine  (VITAMIN B1) tablet 100 mg, 100 mg, Oral, Daily, 100 mg at 07/17/23 0846 **OR** thiamine  (VITAMIN B1) injection 100 mg, 100 mg, Intravenous, Daily, Ray, Neha, MD  Current Outpatient Medications:  .  albuterol  (VENTOLIN  HFA) 108 (90 Base) MCG/ACT inhaler, Inhale 1-2 puffs into the lungs every 6 (six) hours as needed for wheezing or shortness of breath., Disp: 1 g, Rfl: 0 .  ASPIRIN  LOW DOSE 81 MG EC tablet, Take 81 mg by mouth daily., Disp: , Rfl:  .  cetirizine (ZYRTEC) 10 MG tablet, Take 10 mg by mouth daily., Disp: , Rfl:  .  DULoxetine  (CYMBALTA ) 30 MG capsule, Take 60 mg by mouth daily., Disp: , Rfl:  .  fluticasone  (FLONASE ) 50 MCG/ACT nasal spray, Place 2 sprays into both nostrils daily., Disp: 1 g, Rfl: 0 .  folic acid  (FOLVITE ) 1 MG tablet, Take 1 tablet (1 mg total) by mouth daily., Disp: 90 tablet, Rfl: 3 .  gabapentin  (NEURONTIN ) 300 MG capsule, Take 3 capsules by mouth daily., Disp: , Rfl:  .  ipratropium (ATROVENT ) 0.06 % nasal spray, Place 2 sprays into both nostrils 4 (four) times daily., Disp: 15 mL, Rfl: 0 .  levothyroxine  (SYNTHROID ) 50 MCG tablet, Take 50 mcg by mouth daily., Disp: , Rfl:  .  lisinopril  (ZESTRIL ) 10 MG tablet, Take 1 tablet by mouth daily., Disp: , Rfl:  .  meloxicam (MOBIC) 15 MG tablet, Take 15 mg by mouth daily., Disp: , Rfl:  .  metFORMIN  (GLUCOPHAGE ) 500 MG tablet, Take 1 tablet (500 mg total) by mouth 2 (two) times daily with a meal., Disp: 60 tablet, Rfl: 1 .  metoprolol  succinate (TOPROL -XL) 25 MG 24 hr tablet, Take 25 mg by mouth daily., Disp: , Rfl:  .  naproxen  sodium (ANAPROX  DS) 550 MG tablet, Take 1 tablet (550 mg total) by mouth 2 (two) times daily with a meal., Disp: 30 tablet, Rfl: 0 .  norethindrone (MICRONOR) 0.35 MG tablet, Take 1 tablet by mouth daily., Disp: , Rfl:  .  omeprazole (PRILOSEC) 20 MG capsule, Take 1 capsule by mouth daily., Disp: , Rfl:  .  ondansetron  (ZOFRAN -ODT) 4 MG disintegrating tablet,  Take 1 tablet (4 mg total) by mouth every 6 (six) hours as needed for nausea or vomiting., Disp: 20 tablet, Rfl: 0 .  pantoprazole  (PROTONIX ) 40 MG tablet, Take 1 tablet (40 mg total)  by mouth daily., Disp: 30 tablet, Rfl: 1 .  potassium chloride  (KLOR-CON ) 10 MEQ tablet, Take 1 tablet by mouth daily., Disp: , Rfl:  .  promethazine -dextromethorphan (PROMETHAZINE -DM) 6.25-15 MG/5ML syrup, Take 5 mLs by mouth 4 (four) times daily as needed., Disp: 118 mL, Rfl: 0 .  thiamine  (VITAMIN B1) 100 MG tablet, Take 1 tablet (100 mg total) by mouth daily., Disp: 90 tablet, Rfl: 3 .  traZODone  (DESYREL ) 100 MG tablet, Take 100 mg by mouth at bedtime., Disp: , Rfl:  .  ACCU-CHEK GUIDE test strip, USE 1-4 TIMES DAILY AS DIRECTED BY PHYSICIAN TO MONITOR BLOOD SUGARS, Disp: , Rfl:  .  ARMOUR THYROID 120 MG tablet, Take 120 mg by mouth every morning. (Patient not taking: Reported on 07/16/2023), Disp: , Rfl:  .  azithromycin  (ZITHROMAX  Z-PAK) 250 MG tablet, Take 2 tablets on day 1 then 1 tablet daily (Patient not taking: Reported on 07/16/2023), Disp: 6 tablet, Rfl: 0 .  DULoxetine  (CYMBALTA ) 60 MG capsule, Take 60 mg by mouth daily., Disp: , Rfl:  .  levonorgestrel (MIRENA) 20 MCG/DAY IUD, by Intrauterine route., Disp: , Rfl:  .  linaclotide (LINZESS) 290 MCG CAPS capsule, , Disp: , Rfl:  .  OZEMPIC, 0.25 OR 0.5 MG/DOSE, 2 MG/1.5ML SOPN, Inject 0.5 mg into the skin once a week. (Patient not taking: Reported on 07/16/2023), Disp: , Rfl:  .  phenol (CHLORASEPTIC GARGLE) 1.4 % LIQD, Use as directed 1 spray in the mouth or throat as needed for throat irritation / pain., Disp: 20 mL, Rfl: 0 .  predniSONE  (STERAPRED UNI-PAK 21 TAB) 10 MG (21) TBPK tablet, Take by mouth daily. Take 6 tabs by mouth daily for 1, then 5 tabs for 1 day, then 4 tabs for 1 day, then 3 tabs for 1 day, then 2 tabs for 1 day, then 1 tab for 1 day. (Patient not taking: Reported on 07/16/2023), Disp: 21 tablet, Rfl: 0 .  Spacer/Aero-Holding Chambers  (AEROCHAMBER MV) inhaler, Use as instructed, Disp: 1 each, Rfl: 1 .  tranexamic acid (LYSTEDA) 650 MG TABS tablet, Take by mouth. (Patient not taking: Reported on 07/16/2023), Disp: , Rfl:  .  traZODone  (DESYREL ) 50 MG tablet, TAKE 1/2 TO 1 TABLET BY MOUTH EVERY NIGHT 1 HOUR BEFORE BEDTIME (Patient not taking: Reported on 07/16/2023), Disp: , Rfl:   Allergies: No Known Allergies  Dorn Jama Der, NP

## 2023-07-17 NOTE — ED Notes (Signed)
Patient moved to BHU2 

## 2023-07-17 NOTE — Progress Notes (Signed)
   07/17/23 1757  Charting Type  Charting Type Admission  Safety Check Verification  Has the RN verified the 15 minute safety check completion? Yes  Neurological  Neuro (WDL) WDL  HEENT  HEENT (WDL) WDL  Respiratory  Respiratory (WDL) WDL  Cardiac  Cardiac (WDL) X (hx. HTN)  Vascular  Vascular (WDL) X (hx. HTN)  Integumentary  Integumentary (WDL) X  Staff Member Assisting with Skin Assessment on Admission Melissa, RN  Skin Color Appropriate for ethnicity  Skin Condition Dry;Flaky  Skin Integrity Scratch Marks;Other (Comment) (tattoos to upper left chest and upper right back; dry/callused feet)  Scratch Marks Location Chest  Scratch Marks Location Orientation Right;Upper  Scratch Marks Intervention Other (Comment) (assessed;continued monitoring)  Skin Turgor Non-tenting  Braden Scale (Ages 8 and up)  Sensory Perceptions 4  Moisture 4  Activity 4  Mobility 4  Nutrition 3  Friction and Shear 3  Braden Scale Score 22  Musculoskeletal  Musculoskeletal (WDL) WDL  Assistive Device None  Gastrointestinal  Gastrointestinal (WDL) X (hx. GERD)  Last BM Date  07/17/23  GI Symptoms Diarrhea;Nausea  Nausea Precipitating Factors Other (Comment) (alcohol withdrawals)  Nausea relieved by Antiemetic  Diarrhea relieved by Nothing  GU Assessment  Genitourinary (WDL) WDL  Genitalia  Female Genitalia Intact;Menses  Description of Menses Heavy  Neurological  Level of Consciousness Alert

## 2023-07-17 NOTE — BH Assessment (Signed)
 Patient is to be admitted Angela Landry BMU on today 07/17/23. Accepting is [name] NP. Attending is [name] NP. Patient has been assigned to room 306, by Charge Nurse [facility]  ER staff is aware of the admission Luanna/Sherry, ER Secretary Dr. Ernest, ER MD Izetta, Patient's Nurse

## 2023-07-17 NOTE — Group Note (Signed)
 Date:  07/17/2023 Time:  6:24 PM  Group Topic/Focus:  Outdoor recreation structured activity    Participation Level:  Did Not Attend  Additional Commen Angela Landry 07/17/2023, 6:24 PM

## 2023-07-17 NOTE — ED Notes (Signed)
 Patient had one episode of emesis. PRN Zofran  given. Pt calm and cooperative at this time.

## 2023-07-17 NOTE — Progress Notes (Signed)
 Patient is pleasant and cooperative. Denies SI, HI, AVH. Medication compliant. Appropriate with staff and peers. Reports feeling slightly better than earlier. Voiced no concerns or complaints. Visible in milieu. Did attend group and was appropriate with peers. Encouragement and support provided. Safety checks maintained. Meds given as prescribed. Pt receptive and remains safe on unit with q 15 min checks.

## 2023-07-18 DIAGNOSIS — F323 Major depressive disorder, single episode, severe with psychotic features: Secondary | ICD-10-CM | POA: Diagnosis not present

## 2023-07-18 DIAGNOSIS — F322 Major depressive disorder, single episode, severe without psychotic features: Secondary | ICD-10-CM | POA: Diagnosis present

## 2023-07-18 LAB — GLUCOSE, CAPILLARY
Glucose-Capillary: 161 mg/dL — ABNORMAL HIGH (ref 70–99)
Glucose-Capillary: 144 mg/dL — ABNORMAL HIGH (ref 70–99)
Glucose-Capillary: 174 mg/dL — ABNORMAL HIGH (ref 70–99)

## 2023-07-18 LAB — LIPID PANEL
Cholesterol: 145 mg/dL (ref 0–200)
HDL: 76 mg/dL (ref >40)
LDL Cholesterol: 57 mg/dL (ref 0–99)
Total CHOL/HDL Ratio: 1.9 {ratio}
Triglycerides: 61 mg/dL (ref <150)
VLDL: 12 mg/dL (ref 0–40)

## 2023-07-18 LAB — HEMOGLOBIN A1C
Hgb A1c MFr Bld: 8.4 % — ABNORMAL HIGH (ref 4.8–5.6)
Mean Plasma Glucose: 194.38 mg/dL

## 2023-07-18 MED ORDER — GABAPENTIN 100 MG PO CAPS
100.0000 mg | ORAL_CAPSULE | Freq: Every day | ORAL | Status: DC
  Administered 2023-07-18 – 2023-07-24 (×7): 100 mg via ORAL
  Filled 2023-07-18 (×7): qty 1

## 2023-07-18 MED ORDER — CHLORDIAZEPOXIDE HCL 25 MG PO CAPS
25.0000 mg | ORAL_CAPSULE | Freq: Once | ORAL | Status: AC
  Administered 2023-07-18: 25 mg via ORAL
  Filled 2023-07-18: qty 1

## 2023-07-18 MED ORDER — MELATONIN 5 MG PO TABS
5.0000 mg | ORAL_TABLET | Freq: Every day | ORAL | Status: DC
Start: 2023-07-18 — End: 2023-07-24
  Administered 2023-07-18 – 2023-07-23 (×6): 5 mg via ORAL
  Filled 2023-07-18 (×6): qty 1

## 2023-07-18 NOTE — Plan of Care (Signed)
   Problem: Education: Goal: Emotional status will improve Outcome: Progressing Goal: Mental status will improve Outcome: Progressing Goal: Verbalization of understanding the information provided will improve Outcome: Progressing

## 2023-07-18 NOTE — Group Note (Signed)
 Date:  07/18/2023 Time:  10:13 PM  Group Topic/Focus:  Personal Choices and Values:   The focus of this group is to help patients assess and explore the importance of values in their lives, how their values affect their decisions, how they express their values and what opposes their expression.    Participation Level:  Active  Participation Quality:  Appropriate  Affect:  Appropriate  Cognitive:  Appropriate  Insight: Appropriate  Engagement in Group:  Engaged  Modes of Intervention:  Education  Additional Comments:  Patient presented within normal limits. Patient was an active participant in group and supportive to peers. Patient was able to identify triggers and the emotions which arise from being triggered. Patient identified coping strategies which combat negative life and health outcomes.   Ercel Pepitone L 07/18/2023, 10:13 PM

## 2023-07-18 NOTE — [facility] Suicide Risk Assessment (Signed)
 [facility] INPATIENT:  Family/Significant Other Suicide Prevention Education  Suicide Prevention Education:  Patient Refusal for Family/Significant Other Suicide Prevention Education: The patient Angela Landry has refused to provide written consent for family/significant other to be provided Family/Significant Other Suicide Prevention Education during admission and/or prior to discharge.  Physician notified.  SPE completed with pt, as pt refused to consent to family contact. SPI pamphlet provided to pt and pt was encouraged to share information with support network, ask questions, and talk about any concerns relating to SPE. Pt denies access to guns/firearms and verbalized understanding of information provided. Mobile Crisis information also provided to pt.  Nadara JONELLE Fam 07/18/2023, 10:19 AM

## 2023-07-18 NOTE — H&P (Signed)
 Psychiatric Admission Assessment Adult  Patient Identification: Angela Landry MRN:  [id number] Date of Evaluation:  07/18/2023 Chief Complaint:  Adjustment disorder with mixed anxiety and depressed mood [F43.23] Principal Diagnosis: Severe major depression, single episode, with psychotic features (HCC) Diagnosis:  Principal Problem:   Severe major depression, single episode, with psychotic features (HCC) Active Problems:   Suicidal ideation   Alcohol use disorder, severe, dependence (HCC)  History of Present Illness: 43 year old African American female with a history of major depressive disorder (MDD) with psychotic features and alcohol use disorder (AUD). She presented to the emergency department on July 16, 2023, at 4:52 PM, expressing suicidal ideation and feelings of depression.The patient reports a longstanding battle with depression, which has intensified over the past several months. She describes persistent feelings of sadness, hopelessness, and a lack of interest in previously enjoyable activities. Accompanying these depressive symptoms, she has experienced auditory hallucinations and paranoia, which she has attempted to self-medicate with increased alcohol consumption.Recent significant stressors include learning about the impending foreclosure of her home and legal issues related to multiple DWIs, resulting in a court order to serve weekend jail terms starting July 19, 2023. These events have exacerbated her depressive symptoms and alcohol use.The patient acknowledges a pattern of using alcohol as a coping mechanism for her stress and depressive symptoms. She reports multiple psychiatric inpatient admissions and unsuccessful attempts to establish consistent outpatient psychiatric care. She currently engages in weekly therapy sessions with Ms. Roselinda..Collateral information from the patient's mother indicates escalating concerns about the patient's alcohol use and its impact on her  behavior. The family reports instances of the patient becoming violent during intoxication, including altercations with emergency medical services personnel. There have also been threats directed toward family members, leading to the initiation of an involuntary commitment.The patient denies any prior suicide attempts and reports a family history of substance use but no known mental illness. She identifies her family as her primary support system but acknowledges strained relationships due to her behavior. Associated Signs/Symptoms: Depression Symptoms:  anhedonia, insomnia, psychomotor retardation, fatigue, feelings of worthlessness/guilt, difficulty concentrating, hopelessness, impaired memory, recurrent thoughts of death, anxiety, disturbed sleep, weight loss, decreased appetite, (Hypo) Manic Symptoms:  Distractibility, Impulsivity, Anxiety Symptoms:  Excessive Worry, Psychotic Symptoms:  Paranoia, PTSD Symptoms: Re-experiencing:  Flashbacks Nightmares Total Time spent with patient: 2.5 hours  Past Psychiatric History: MDD   Is the patient at risk to self? Yes.    Has the patient been a risk to self in the past 6 months? Yes.    Has the patient been a risk to self within the distant past? Yes.    Is the patient a risk to others? Yes.    Has the patient been a risk to others in the past 6 months? Yes.    Has the patient been a risk to others within the distant past? No.   Columbia Scale:  Flowsheet Row Admission (Current) from 07/17/2023 in [facility] ED from 07/16/2023 in [facility] at [facility] ED from 07/03/2023 in [facility] at [facility]  C-SSRS RISK CATEGORY Error: Q3, 4, or 5 should not be populated when Q2 is No Low Risk No Risk        Prior Inpatient Therapy: No. If yes, describe none  Prior Outpatient Therapy: Yes.   If yes, describe Miss Laney   Alcohol Screening: 1. How often do  you have a drink containing alcohol?: 2 to 4 times a month 2. How many  drinks containing alcohol do you have on a typical day when you are drinking?: 7, 8, or 9 3. How often do you have six or more drinks on one occasion?: Monthly AUDIT-C Score: 7 4. How often during the last year have you found that you were not able to stop drinking once you had started?: Never 5. How often during the last year have you failed to do what was normally expected from you because of drinking?: Never 6. How often during the last year have you needed a first drink in the morning to get yourself going after a heavy drinking session?: Never 7. How often during the last year have you had a feeling of guilt of remorse after drinking?: Never 8. How often during the last year have you been unable to remember what happened the night before because you had been drinking?: Never 9. Have you or someone else been injured as a result of your drinking?: No 10. Has a relative or friend or a doctor or another health worker been concerned about your drinking or suggested you cut down?: No Alcohol Use Disorder Identification Test Final Score (AUDIT): 7 Alcohol Brief Interventions/Follow-up: Alcohol education/Brief advice Substance Abuse History in the last 12 months:  Yes.   Consequences of Substance Abuse: Legal Consequences:  has DWI's pending weekend jail for 90 days DT's: tremors Withdrawal Symptoms:   Diarrhea Headaches Nausea Tremors Previous Psychotropic Medications: Yes  Psychological Evaluations: No  Past Medical History:  Past Medical History:  Diagnosis Date   Diabetes mellitus without complication (HCC)    Fibromyalgia    GERD (gastroesophageal reflux disease)    IBS (irritable bowel syndrome)    Thyroid disease    History reviewed. No pertinent surgical history. Family History: History reviewed. No pertinent family history. Family Psychiatric  History: none reported Tobacco Screening:  Social History    Tobacco Use  Smoking Status Every Day   Types: Cigarettes  Smokeless Tobacco Never    BH Tobacco Counseling     Are you interested in Tobacco Cessation Medications?  Yes, implement Nicotene Replacement Protocol Counseled patient on smoking cessation:  Refused/Declined practical counseling Reason Tobacco Screening Not Completed: No value filed.       Social History:  Social History   Substance and Sexual Activity  Alcohol Use Yes     Social History   Substance and Sexual Activity  Drug Use Never    Additional Social History: Marital status: Single Are you sexually active?:  (Unable to assess) What is your sexual orientation?: Unable to assess Has your sexual activity been affected by drugs, alcohol, medication, or emotional stress?: Unable to assess Does patient have children?: Yes How many children?: 37 (51 year old) How is patient's relationship with their children?: It used to be good but I guess I destroyed that with drinking too. I haven't seen him since New Years.                         Allergies:  No Known Allergies Lab Results:  Results for orders placed or performed during the hospital encounter of 07/17/23 (from the past 48 hours)  Glucose, capillary     Status: Abnormal   Collection Time: 07/17/23  8:15 PM  Result Value Ref Range   Glucose-Capillary 166 (H) 70 - 99 mg/dL    Comment: Glucose reference range applies only to samples taken after fasting for at least 8 hours.  Hemoglobin A1c     Status: Abnormal  Collection Time: 07/18/23  7:41 AM  Result Value Ref Range   Hgb A1c MFr Bld 8.4 (H) 4.8 - 5.6 %    Comment: (NOTE) Pre diabetes:          5.7%-6.4%  Diabetes:              >6.4%  Glycemic control for   <7.0% adults with diabetes    Mean Plasma Glucose 194.38 mg/dL    Comment: Performed at [facility], [address], [place], [place] [address]  Lipid panel     Status: None   Collection Time: 07/18/23  7:41 AM  Result  Value Ref Range   Cholesterol 145 0 - 200 mg/dL   Triglycerides 61 <849 mg/dL   HDL 76 >59 mg/dL   Total CHOL/HDL Ratio 1.9 RATIO   VLDL 12 0 - 40 mg/dL   LDL Cholesterol 57 0 - 99 mg/dL    Comment:        Total Cholesterol/HDL:CHD Risk Coronary Heart Disease Risk Table                     Men   Women  1/2 Average Risk   3.4   3.3  Average Risk       5.0   4.4  2 X Average Risk   9.6   7.1  3 X Average Risk  23.4   11.0        Use the calculated Patient Ratio above and the CHD Risk Table to determine the patient's CHD Risk.        ATP III CLASSIFICATION (LDL):  <100     mg/dL   Optimal  899-870  mg/dL   Near or Above                    Optimal  130-159  mg/dL   Borderline  839-810  mg/dL   High  >809     mg/dL   Very High Performed at [facility] [address] [place], [place] [address]   Glucose, capillary     Status: Abnormal   Collection Time: 07/18/23  7:54 AM  Result Value Ref Range   Glucose-Capillary 161 (H) 70 - 99 mg/dL    Comment: Glucose reference range applies only to samples taken after fasting for at least 8 hours.   Comment 1 Notify RN     Blood Alcohol level:  Lab Results  Component Value Date   ETH 363 (HH) 07/16/2023   ETH 225 (H) 07/03/2023    Metabolic Disorder Labs:  Lab Results  Component Value Date   HGBA1C 8.4 (H) 07/18/2023   MPG 194.38 07/18/2023   No results found for: PROLACTIN Lab Results  Component Value Date   CHOL 145 07/18/2023   TRIG 61 07/18/2023   HDL 76 07/18/2023   CHOLHDL 1.9 07/18/2023   VLDL 12 07/18/2023   LDLCALC 57 07/18/2023    Current Medications: Current Facility-Administered Medications  Medication Dose Route Frequency Provider Last Rate Last Admin   acetaminophen  (TYLENOL ) tablet 650 mg  650 mg Oral Q6H PRN Saucier, Dorn Ruth, NP       alum & mag hydroxide-simeth (MAALOX/MYLANTA) 200-200-20 MG/5ML suspension 30 mL  30 mL Oral Q4H PRN Saucier, Dorn Ruth, NP       chlordiazePOXIDE   (LIBRIUM ) capsule 25 mg  25 mg Oral Q6H PRN Nicholaus Brad RAMAN, NP       chlordiazePOXIDE  (LIBRIUM ) capsule 25 mg  25 mg Oral QID Ethell Blatchford S, NP   25 mg at 07/18/23 1118   Followed by   NOREEN ON 07/19/2023] chlordiazePOXIDE  (LIBRIUM ) capsule 25 mg  25 mg Oral TID Nicholaus Brad RAMAN, NP       Followed by   NOREEN ON 07/20/2023] chlordiazePOXIDE  (LIBRIUM ) capsule 25 mg  25 mg Oral Letha Nicholaus Brad RAMAN, NP       Followed by   NOREEN ON 07/21/2023] chlordiazePOXIDE  (LIBRIUM ) capsule 25 mg  25 mg Oral Daily Nicholaus Brad RAMAN, NP       haloperidol  lactate (HALDOL ) injection 5 mg  5 mg Intramuscular TID PRN Nicholaus Brad RAMAN, NP       And   diphenhydrAMINE  (BENADRYL ) injection 50 mg  50 mg Intramuscular TID PRN Nicholaus Brad RAMAN, NP       And   LORazepam  (ATIVAN ) injection 2 mg  2 mg Intramuscular TID PRN Nicholaus Brad RAMAN, NP       DULoxetine  (CYMBALTA ) DR capsule 60 mg  60 mg Oral QHS Nicholaus Brad RAMAN, NP   60 mg at 07/17/23 2121   gabapentin  (NEURONTIN ) capsule 100 mg  100 mg Oral Daily Nicholaus Brad RAMAN, NP   100 mg at 07/18/23 1219   gabapentin  (NEURONTIN ) capsule 400 mg  400 mg Oral QHS Anand Tejada S, NP   400 mg at 07/17/23 2122   ibuprofen  (ADVIL ) tablet 600 mg  600 mg Oral Q8H PRN Saucier, Dorn Ruth, NP       levothyroxine  (SYNTHROID ) tablet 50 mcg  50 mcg Oral Q0600 Octavie Westerhold S, NP   50 mcg at 07/18/23 9376   loperamide  (IMODIUM ) capsule 2-4 mg  2-4 mg Oral PRN Rosine Solecki S, NP       magnesium  hydroxide (MILK OF MAGNESIA) suspension 30 mL  30 mL Oral Daily PRN Saucier, Dorn Ruth, NP       metFORMIN  (GLUCOPHAGE ) tablet 500 mg  500 mg Oral BID AC Aeon Koors S, NP   500 mg at 07/18/23 9171   metoprolol  succinate (TOPROL -XL) 24 hr tablet 25 mg  25 mg Oral Daily Nicholaus Brad RAMAN, NP   25 mg at 07/18/23 9171   multivitamin with minerals tablet 1 tablet  1 tablet Oral Daily Nicholaus Brad RAMAN, NP   1 tablet at 07/18/23 9171   nicotine  (NICODERM CQ  - dosed in mg/24 hours) patch 21 mg  21 mg Transdermal Daily Parmar, Meenakshi,  MD   21 mg at 07/18/23 0829   ondansetron  (ZOFRAN -ODT) disintegrating tablet 4 mg  4 mg Oral Q6H PRN Ambyr Qadri S, NP   4 mg at 07/18/23 0240   pantoprazole  (PROTONIX ) EC tablet 40 mg  40 mg Oral Daily Loyalty Arentz S, NP   40 mg at 07/18/23 9171   thiamine  (VITAMIN B1) tablet 100 mg  100 mg Oral Daily Nicholaus Brad RAMAN, NP   100 mg at 07/18/23 9171   traZODone  (DESYREL ) tablet 150 mg  150 mg Oral QHS Edinson Domeier S, NP   150 mg at 07/17/23 2121   PTA Medications: Medications Prior to Admission  Medication Sig Dispense Refill Last Dose/Taking   ACCU-CHEK GUIDE test strip USE 1-4 TIMES DAILY AS DIRECTED BY PHYSICIAN TO MONITOR BLOOD SUGARS      albuterol  (VENTOLIN  HFA) 108 (90 Base) MCG/ACT inhaler Inhale 1-2 puffs into the lungs every 6 (six) hours as needed for wheezing or shortness of breath. 1 g 0    ARMOUR THYROID 120 MG tablet Take 120  mg by mouth every morning. (Patient not taking: Reported on 07/16/2023)      ASPIRIN  LOW DOSE 81 MG EC tablet Take 81 mg by mouth daily.      azithromycin  (ZITHROMAX  Z-PAK) 250 MG tablet Take 2 tablets on day 1 then 1 tablet daily (Patient not taking: Reported on 07/16/2023) 6 tablet 0    cetirizine (ZYRTEC) 10 MG tablet Take 10 mg by mouth daily.      DULoxetine  (CYMBALTA ) 30 MG capsule Take 60 mg by mouth daily.      DULoxetine  (CYMBALTA ) 60 MG capsule Take 60 mg by mouth daily.      fluticasone  (FLONASE ) 50 MCG/ACT nasal spray Place 2 sprays into both nostrils daily. 1 g 0    folic acid  (FOLVITE ) 1 MG tablet Take 1 tablet (1 mg total) by mouth daily. 90 tablet 3    gabapentin  (NEURONTIN ) 300 MG capsule Take 3 capsules by mouth daily.      ipratropium (ATROVENT ) 0.06 % nasal spray Place 2 sprays into both nostrils 4 (four) times daily. 15 mL 0    levonorgestrel (MIRENA) 20 MCG/DAY IUD by Intrauterine route.      levothyroxine  (SYNTHROID ) 50 MCG tablet Take 50 mcg by mouth daily.      linaclotide (LINZESS) 290 MCG CAPS capsule  (Patient not taking: Reported on  07/16/2023)      lisinopril  (ZESTRIL ) 10 MG tablet Take 1 tablet by mouth daily.      meloxicam (MOBIC) 15 MG tablet Take 15 mg by mouth daily.      metFORMIN  (GLUCOPHAGE ) 500 MG tablet Take 1 tablet (500 mg total) by mouth 2 (two) times daily with a meal. 60 tablet 1    metoprolol  succinate (TOPROL -XL) 25 MG 24 hr tablet Take 25 mg by mouth daily.      naproxen  sodium (ANAPROX  DS) 550 MG tablet Take 1 tablet (550 mg total) by mouth 2 (two) times daily with a meal. 30 tablet 0    norethindrone (MICRONOR) 0.35 MG tablet Take 1 tablet by mouth daily.      omeprazole (PRILOSEC) 20 MG capsule Take 1 capsule by mouth daily.      ondansetron  (ZOFRAN -ODT) 4 MG disintegrating tablet Take 1 tablet (4 mg total) by mouth every 6 (six) hours as needed for nausea or vomiting. 20 tablet 0    OZEMPIC, 0.25 OR 0.5 MG/DOSE, 2 MG/1.5ML SOPN Inject 0.5 mg into the skin once a week. (Patient not taking: Reported on 07/16/2023)      pantoprazole  (PROTONIX ) 40 MG tablet Take 1 tablet (40 mg total) by mouth daily. 30 tablet 1    phenol (CHLORASEPTIC GARGLE) 1.4 % LIQD Use as directed 1 spray in the mouth or throat as needed for throat irritation / pain. 20 mL 0    potassium chloride  (KLOR-CON ) 10 MEQ tablet Take 1 tablet by mouth daily.      predniSONE  (STERAPRED UNI-PAK 21 TAB) 10 MG (21) TBPK tablet Take by mouth daily. Take 6 tabs by mouth daily for 1, then 5 tabs for 1 day, then 4 tabs for 1 day, then 3 tabs for 1 day, then 2 tabs for 1 day, then 1 tab for 1 day. (Patient not taking: Reported on 07/16/2023) 21 tablet 0    promethazine -dextromethorphan (PROMETHAZINE -DM) 6.25-15 MG/5ML syrup Take 5 mLs by mouth 4 (four) times daily as needed. 118 mL 0    Spacer/Aero-Holding Chambers (AEROCHAMBER MV) inhaler Use as instructed 1 each 1    thiamine  (VITAMIN B1)  100 MG tablet Take 1 tablet (100 mg total) by mouth daily. 90 tablet 3    tranexamic acid (LYSTEDA) 650 MG TABS tablet Take by mouth. (Patient not taking: Reported on  07/16/2023)      traZODone  (DESYREL ) 100 MG tablet Take 100 mg by mouth at bedtime.      traZODone  (DESYREL ) 50 MG tablet TAKE 1/2 TO 1 TABLET BY MOUTH EVERY NIGHT 1 HOUR BEFORE BEDTIME (Patient not taking: Reported on 07/16/2023)       Musculoskeletal: Strength & Muscle Tone: abnormal Gait & Station: normal Patient leans: N/A            Psychiatric Specialty Exam:  Presentation  General Appearance:  Disheveled (exhibits poor hygiene.)  Eye Contact: Minimal (She is cooperative during the interview but displays psychomotor retardation.)  Speech: Slow (delayed responses.)  Speech Volume: Decreased  Handedness: Right   Mood and Affect  Mood: Depressed  Affect: Flat; Tearful   Thought Process  Thought Processes: Coherent (exhibits significant thought blocking.)  Duration of Psychotic Symptoms: Depressive symptoms ongoing for 2 months Past Diagnosis of Schizophrenia or Psychoactive disorder: No  Descriptions of Associations:Intact  Orientation:Partial  Thought Content:Scattered; Paranoid Ideation (believing that others are plotting against her.)  Hallucinations:Hallucinations: Auditory Description of Auditory Hallucinations: people are talking about me  Ideas of Reference:Paranoia  Suicidal Thoughts:Suicidal Thoughts: No  Homicidal Thoughts:Homicidal Thoughts: No   Sensorium  Memory: Immediate Fair; Recent Fair; Remote Fair  Judgment: Poor (continued alcohol use despite negative consequences and non-compliance with previous treatment recommendations.)  Insight: Poor (into her condition, attributing her experiences solely to external stressors.)   Executive Functions  Concentration: Poor  Attention Span: Poor  Recall: [employer] of Knowledge: Fair  Language: Good   Psychomotor Activity  Psychomotor Activity: Psychomotor Activity: Psychomotor Retardation   Assets  Assets: Social Support; Housing   Sleep   Sleep: Sleep: Fair Number of Hours of Sleep: 5    Physical Exam: Physical Exam Vitals and nursing note reviewed.  Constitutional:      Appearance: Normal appearance.  HENT:     Head: Normocephalic and atraumatic.     Nose: Nose normal.  Pulmonary:     Effort: Pulmonary effort is normal.  Musculoskeletal:        General: Normal range of motion.     Cervical back: Normal range of motion.  Neurological:     General: No focal deficit present.     Mental Status: She is alert. Mental status is at baseline.  Psychiatric:        Attention and Perception: Attention normal. She perceives auditory hallucinations.        Mood and Affect: Mood is anxious and depressed. Affect is flat and tearful.        Speech: Speech is delayed and slurred.        Behavior: Behavior is slowed. Behavior is cooperative.        Thought Content: Thought content is paranoid.        Cognition and Memory: Cognition is impaired. She exhibits impaired recent memory.        Judgment: Judgment is impulsive.    Review of Systems  Gastrointestinal:  Positive for constipation and heartburn.  Neurological:  Positive for tremors, weakness and headaches.  Psychiatric/Behavioral:  Positive for depression and hallucinations. The patient is nervous/anxious and has insomnia.   All other systems reviewed and are negative.  Blood pressure 121/84, pulse 83, temperature (!) 97.3 F (36.3 C), resp. rate 16, height  5' 5 (1.651 m), weight 94.8 kg, SpO2 100%. Body mass index is 34.78 kg/m.  Treatment Plan Summary: Daily contact with patient to assess and evaluate symptoms and progress in treatment and Medication management CIWA-Libruim protocol, with a tapering schedule to mitigate withdrawal symptoms. Duloxetine  (Cymbalta ) 60 mg daily for major depressive disorder and generalized anxiety disorder Gabapentin  100 mg daily and 400 mg at night.an adjunct for seizure management. Pantoprazole  (Protonix ) 40 mg daily for  gastroesophageal reflux disease Metoprolol  Succinate (Toprol  XL) 25 mg daily Beta-blocker used for hypertension Multivitamin (MVI) supplement dietary intake and prevent deficiencies. Thiamine  100 mg daily prevent Wernicke's encephalopathy, especially in patients with a history of alcohol use disorder Encourage a balanced diet to support overall health and recovery. Monitor nutritional status, especially considering the patient's alcohol use history.  Observation Level/Precautions:  Continuous Observation Detox 15 minute checks Seizure  Laboratory:   LIPIDS  Psychotherapy:    Medications:    Consultations:    Discharge Concerns:    Estimated LOS:  Other:     Physician Treatment Plan for Primary Diagnosis: Severe major depression, single episode, with psychotic features (HCC) Long Term Goal(s): Improvement in symptoms so as ready for discharge  Short Term Goals: Ability to identify changes in lifestyle to reduce recurrence of condition will improve, Ability to verbalize feelings will improve, Ability to disclose and discuss suicidal ideas, Ability to demonstrate self-control will improve, Ability to identify and develop effective coping behaviors will improve, Ability to maintain clinical measurements within normal limits will improve, Compliance with prescribed medications will improve, and Ability to identify triggers associated with substance abuse/mental health issues will improve  Physician Treatment Plan for Secondary Diagnosis: Principal Problem:   Severe major depression, single episode, with psychotic features (HCC) Active Problems:   Suicidal ideation   Alcohol use disorder, severe, dependence (HCC)  Long Term Goal(s): Improvement in symptoms so as ready for discharge  Short Term Goals: Ability to identify changes in lifestyle to reduce recurrence of condition will improve, Ability to verbalize feelings will improve, Ability to disclose and discuss suicidal ideas, Ability to  demonstrate self-control will improve, Ability to identify and develop effective coping behaviors will improve, Ability to maintain clinical measurements within normal limits will improve, Compliance with prescribed medications will improve, and Ability to identify triggers associated with substance abuse/mental health issues will improve  I certify that inpatient services furnished can reasonably be expected to improve the patient's condition.    Brad GORMAN Moats, NP 2/6/20253:54 PM
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
## 2023-07-18 NOTE — Progress Notes (Signed)
   07/18/23 0828  Psych Admission Type (Psych Patients Only)  Admission Status Involuntary  Psychosocial Assessment  Patient Complaints Anxiety  Eye Contact Fair  Facial Expression Flat  Affect Anxious  Speech Logical/coherent  Interaction Assertive  Motor Activity Slow  Appearance/Hygiene Unremarkable  Behavior Characteristics Cooperative;Appropriate to situation  Mood Pleasant;Anxious  Thought Process  Coherency WDL  Content WDL  Delusions None reported or observed  Perception WDL  Hallucination None reported or observed  Judgment Impaired  Confusion None  Danger to Self  Current suicidal ideation? Denies  Agreement Not to Harm Self Yes  Description of Agreement Verbal  Danger to Others  Danger to Others None reported or observed

## 2023-07-18 NOTE — Progress Notes (Signed)
 Patient awakened out of sleep vomiting. Prn Zofran  given.

## 2023-07-18 NOTE — Group Note (Signed)
 Recreation Therapy Group Note   Group Topic:Self-Esteem  Group Date: 07/18/2023 Start Time: 1000 End Time: 1055 Facilitators: Celestia Jeoffrey BRAVO, LRT, CTRS Location:  Craft Room  Group Description: Positive Affirmation Worksheet. Patients and LRT discussed the importance of self-love/self-esteem and things that cause it to fluctuate, including our mental health. Pts received a large index card and marker. Pts were prompted to write their first name on the card and then pass the card to their peer on the right. Pts were then encouraged to write a positive affirmation or give a compliment to that person. Pt's passed around cards until everyone was able to write one for each person. LRT then collected the index cards and handed them back to the original person. Pts then completed a worksheet that helps them identify 24 different strengths and qualities about themselves. Pt encouraged to read aloud at least 2 off their sheet to the group. LRT and pts discussed how this can be applied to daily life post-discharge.   Goal Area(s) Addressed: Patient will identify positive qualities about themselves. Patient will learn new positive affirmations.  Patient will recite positive qualities and affirmations aloud to the group.  Patient will practice positive self-talk.  Patient will increase communication.   Affect/Mood: Appropriate   Participation Level: Active and Engaged   Participation Quality: Independent   Behavior: Calm and Cooperative   Speech/Thought Process: Coherent   Insight: Good   Judgement: Good   Modes of Intervention: Clarification, Education, Guided Discussion, Support, and Worksheet   Patient Response to Interventions:  Attentive, Engaged, Interested , and Receptive   Education Outcome:  Acknowledges education   Clinical Observations/Individualized Feedback: Angela Landry was active in their participation of session activities and group discussion. Pt identified I like myself  because I never give up and I keep pushing. God and my family loves me. What I really enjoy the most is cooking and traveling. Pt interacted well with LRT and peers duration of session.    Plan: Continue to engage patient in RT group sessions 2-3x/week.   Jeoffrey BRAVO Celestia, LRT, CTRS 07/18/2023 1:13 PM
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
## 2023-07-18 NOTE — Group Note (Signed)
 Date:  07/18/2023 Time:  10:26 AM  Group Topic/Focus:   Goals Group:   The focus of this group is to help patients establish daily goals to achieve during treatment and discuss how the patient can incorporate goal setting into their daily lives to aide in recovery  Overcoming Stress:   The focus of this group is to define stress and help patients assess their triggers.   Participation Level:  Did Not Attend   Lessie Manigo A Yovanni Frenette 07/18/2023, 10:26 AM

## 2023-07-18 NOTE — BHH Counselor (Signed)
 Pt received contact information for [facility] office and [employer], per request.  Angela Landry. [name], MSW, LCSW, LCAS 07/18/2023 11:18 AM

## 2023-07-18 NOTE — Group Note (Signed)
 LCSW Group Therapy Note  Group Date: 07/18/2023 Start Time: 1300 End Time: 1400   Type of Therapy and Topic:  Group Therapy: Anger Cues and Responses  Participation Level:  Active   Description of Group:   In this group, patients learned how to recognize the physical, cognitive, emotional, and behavioral responses they have to anger-provoking situations.  They identified a recent time they became angry and how they reacted.  They analyzed how their reaction was possibly beneficial and how it was possibly unhelpful.  The group discussed a variety of healthier coping skills that could help with such a situation in the future.  Focus was placed on how helpful it is to recognize the underlying emotions to our anger, because working on those can lead to a more permanent solution as well as our ability to focus on the important rather than the urgent.  Therapeutic Goals: Patients will remember their last incident of anger and how they felt emotionally and physically, what their thoughts were at the time, and how they behaved. Patients will identify how their behavior at that time worked for them, as well as how it worked against them. Patients will explore possible new behaviors to use in future anger situations. Patients will learn that anger itself is normal and cannot be eliminated, and that healthier reactions can assist with resolving conflict rather than worsening situations.  Summary of Patient Progress:   Patient was active during the group. She shared a recent occurrence wherein feeling sadness led to anger. She demonstrated fair insight into the subject matter, was respectful of peers, and participated throughout the entire session.  Therapeutic Modalities:   Cognitive Behavioral Therapy    Sherryle JINNY Margo, LCSWA 07/18/2023  2:15 PM

## 2023-07-19 DIAGNOSIS — F323 Major depressive disorder, single episode, severe with psychotic features: Secondary | ICD-10-CM | POA: Diagnosis not present

## 2023-07-19 LAB — GLUCOSE, CAPILLARY
Glucose-Capillary: 167 mg/dL — ABNORMAL HIGH (ref 70–99)
Glucose-Capillary: 166 mg/dL — ABNORMAL HIGH (ref 70–99)
Glucose-Capillary: 156 mg/dL — ABNORMAL HIGH (ref 70–99)
Glucose-Capillary: 165 mg/dL — ABNORMAL HIGH (ref 70–99)

## 2023-07-19 MED ORDER — MENTHOL 3 MG MT LOZG
1.0000 | LOZENGE | OROMUCOSAL | Status: DC | PRN
  Administered 2023-07-19 – 2023-07-23 (×4): 3 mg via ORAL
  Filled 2023-07-19: qty 9

## 2023-07-19 NOTE — Group Note (Signed)
 Date:  07/19/2023 Time:  10:30 AM  Group Topic/Focus:  Goals Group:   The focus of this group is to help patients establish daily goals to achieve during treatment and discuss how the patient can incorporate goal setting into their daily lives to aide in recovery.    Participation Level:  Active  Participation Quality:  Appropriate  Affect:  Appropriate  Cognitive:  Appropriate  Insight: Appropriate  Engagement in Group:  Engaged  Modes of Intervention:  Discussion, Education, and Support  Additional Comments:    Angela Landry 07/19/2023, 10:30 AM

## 2023-07-19 NOTE — Group Note (Signed)
 Date:  07/19/2023 Time:  9:05 PM  Group Topic/Focus:  Managing Feelings:   The focus of this group is to identify what feelings patients have difficulty handling and develop a plan to handle them in a healthier way upon discharge.    Participation Level:  Active  Participation Quality:  Appropriate  Affect:  Appropriate  Cognitive:  Appropriate  Insight: Appropriate  Engagement in Group:  Engaged  Modes of Intervention:  Education  Additional Comments:  Patient presented well and was able to identify steps needed to recover from addictive behaviors.   Khair Chasteen L 07/19/2023, 9:05 PM

## 2023-07-19 NOTE — Progress Notes (Signed)
 Patient pleasant and cooperative most of early shift. Reports more improvement today.. States taking it one day at a time. Visible in milieu. Did attend group. Denies SI, HI, AVH. Patient had vomiting episode, prn given, awaiting effectiveness. No other complaints or concerns voiced. Encouragement and support provided. Safety checks maintained. Meds given as prescribed. Pt receptive and remains safe on unit with q 15 min checks.

## 2023-07-19 NOTE — Plan of Care (Signed)
   Problem: Education: Goal: Emotional status will improve Outcome: Progressing Goal: Mental status will improve Outcome: Progressing Goal: Verbalization of understanding the information provided will improve Outcome: Progressing   Problem: Coping: Goal: Ability to verbalize frustrations and anger appropriately will improve Outcome: Progressing   Problem: Safety: Goal: Periods of time without injury will increase Outcome: Progressing

## 2023-07-19 NOTE — Group Note (Signed)
 Recreation Therapy Group Note   Group Topic:Leisure Education  Group Date: 07/19/2023 Start Time: 1000 End Time: 1100 Facilitators: Celestia Jeoffrey BRAVO, LRT, CTRS Location:  Craft Room  Group Description: Leisure. Patients were given the option to choose from singing karaoke, coloring mandalas, using oil pastels, journaling, or playing with play-doh. LRT and pts discussed the meaning of leisure, the importance of participating in leisure during their free time/when they're outside of the hospital, as well as how our leisure interests can also serve as coping skills.   Goal Area(s) Addressed:  Patient will identify a current leisure interest.  Patient will learn the definition of "leisure". Patient will practice making a positive decision. Patient will have the opportunity to try a new leisure activity. Patient will communicate with peers and LRT.    Affect/Mood: Appropriate   Participation Level: Active and Engaged   Participation Quality: Independent   Behavior: Appropriate, Calm, and Cooperative   Speech/Thought Process: Coherent   Insight: Good   Judgement: Good   Modes of Intervention: Activity, Education, Exploration, Music, and Rapport Building   Patient Response to Interventions:  Attentive, Engaged, and Receptive   Education Outcome:  Acknowledges education   Clinical Observations/Individualized Feedback: Denice was active in their participation of session activities and group discussion. Pt identified diamond paint and garden as things she does in her free time. Pt chose to sing karaoke while present in group. Pt interacted well with LRT and peers duration of session.    Plan: Continue to engage patient in RT group sessions 2-3x/week.   Jeoffrey BRAVO Celestia, LRT, CTRS 07/19/2023 12:25 PM

## 2023-07-19 NOTE — Group Note (Signed)
 Date:  07/19/2023 Time:  4:48 PM  Group Topic/Focus:  Outdoor Recreation    Participation Level:  Active  Participation Quality:  Appropriate  Affect:  Appropriate  Cognitive:  Appropriate  Insight: Appropriate  Engagement in Group:  Engaged  Modes of Intervention:  Activity  Additional Comments:    Deitra Caron [place] 07/19/2023, 4:48 PM

## 2023-07-19 NOTE — Group Note (Signed)
 Date:  07/18/2023 Time:  3:30 pm  Group Topic/Focus:  Outdoor Recreation    Participation Level:  Active  Participation Quality:  Appropriate  Affect:  Appropriate  Cognitive:  Appropriate  Insight: Appropriate  Engagement in Group:  Engaged  Modes of Intervention:  Activity  Additional Comments:    Deitra Clap [facility] 07/19/2023, 10:23 AM

## 2023-07-19 NOTE — Progress Notes (Signed)
   07/19/23 1033  Psych Admission Type (Psych Patients Only)  Admission Status Involuntary  Psychosocial Assessment  Patient Complaints None  Eye Contact Brief  Facial Expression Flat  Affect Flat  Speech Logical/coherent  Interaction Assertive  Motor Activity Slow  Appearance/Hygiene In scrubs  Behavior Characteristics Cooperative  Mood Pleasant  Thought Process  Coherency WDL  Content WDL  Delusions None reported or observed  Perception WDL  Hallucination None reported or observed  Judgment WDL  Confusion None  Danger to Self  Current suicidal ideation? Denies  Agreement Not to Harm Self Yes  Description of Agreement verbal  Danger to Others  Danger to Others None reported or observed

## 2023-07-19 NOTE — BH IP Treatment Plan (Signed)
 Interdisciplinary Treatment and Diagnostic Plan Update  07/19/2023 Time of Session: 09:40 Angela Landry MRN: [id number]  Principal Diagnosis: Severe major depression, single episode, with psychotic features (HCC)  Secondary Diagnoses: Principal Problem:   Severe major depression, single episode, with psychotic features (HCC) Active Problems:   Suicidal ideation   Alcohol use disorder, severe, dependence (HCC)   Current Medications:  Current Facility-Administered Medications  Medication Dose Route Frequency Provider Last Rate Last Admin   acetaminophen  (TYLENOL ) tablet 650 mg  650 mg Oral Q6H PRN Saucier, Dorn Ruth, NP       alum & mag hydroxide-simeth (MAALOX/MYLANTA) 200-200-20 MG/5ML suspension 30 mL  30 mL Oral Q4H PRN Saucier, Dorn Ruth, NP       chlordiazePOXIDE  (LIBRIUM ) capsule 25 mg  25 mg Oral Q6H PRN Nicholaus Brad RAMAN, NP   25 mg at 07/19/23 0235   chlordiazePOXIDE  (LIBRIUM ) capsule 25 mg  25 mg Oral TID Nicholaus Brad RAMAN, NP   25 mg at 07/19/23 9063   Followed by   NOREEN ON 07/20/2023] chlordiazePOXIDE  (LIBRIUM ) capsule 25 mg  25 mg Oral Letha Nicholaus Brad RAMAN, NP       Followed by   NOREEN ON 07/21/2023] chlordiazePOXIDE  (LIBRIUM ) capsule 25 mg  25 mg Oral Daily Nicholaus Brad RAMAN, NP       haloperidol  lactate (HALDOL ) injection 5 mg  5 mg Intramuscular TID PRN Nicholaus Brad RAMAN, NP       And   diphenhydrAMINE  (BENADRYL ) injection 50 mg  50 mg Intramuscular TID PRN Nicholaus Brad RAMAN, NP       And   LORazepam  (ATIVAN ) injection 2 mg  2 mg Intramuscular TID PRN Nicholaus Brad RAMAN, NP       DULoxetine  (CYMBALTA ) DR capsule 60 mg  60 mg Oral QHS Nicholaus Brad RAMAN, NP   60 mg at 07/18/23 2106   gabapentin  (NEURONTIN ) capsule 100 mg  100 mg Oral Daily Nicholaus Brad RAMAN, NP   100 mg at 07/19/23 9063   gabapentin  (NEURONTIN ) capsule 400 mg  400 mg Oral QHS Davis, Nina S, NP   400 mg at 07/18/23 2106   ibuprofen  (ADVIL ) tablet 600 mg  600 mg Oral Q8H PRN Saucier, Dorn Ruth, NP       levothyroxine   (SYNTHROID ) tablet 50 mcg  50 mcg Oral Q0600 Davis, Nina S, NP   50 mcg at 07/19/23 0624   loperamide  (IMODIUM ) capsule 2-4 mg  2-4 mg Oral PRN Nicholaus Brad RAMAN, NP       magnesium  hydroxide (MILK OF MAGNESIA) suspension 30 mL  30 mL Oral Daily PRN Saucier, Dorn Ruth, NP       melatonin tablet 5 mg  5 mg Oral QHS Davis, Nina S, NP   5 mg at 07/18/23 2106   metFORMIN  (GLUCOPHAGE ) tablet 500 mg  500 mg Oral BID AC Davis, Nina S, NP   500 mg at 07/19/23 9063   metoprolol  succinate (TOPROL -XL) 24 hr tablet 25 mg  25 mg Oral Daily Nicholaus Brad RAMAN, NP   25 mg at 07/19/23 0937   multivitamin with minerals tablet 1 tablet  1 tablet Oral Daily Nicholaus Brad RAMAN, NP   1 tablet at 07/19/23 9062   nicotine  (NICODERM CQ  - dosed in mg/24 hours) patch 21 mg  21 mg Transdermal Daily Parmar, Meenakshi, MD   21 mg at 07/19/23 0936   ondansetron  (ZOFRAN -ODT) disintegrating tablet 4 mg  4 mg Oral Q6H PRN Davis, Nina S, NP   4 mg at  07/19/23 0012   pantoprazole  (PROTONIX ) EC tablet 40 mg  40 mg Oral Daily Nicholaus Brad RAMAN, NP   40 mg at 07/19/23 9062   thiamine  (VITAMIN B1) tablet 100 mg  100 mg Oral Daily Nicholaus Brad RAMAN, NP   100 mg at 07/19/23 9063   traZODone  (DESYREL ) tablet 150 mg  150 mg Oral QHS Davis, Nina S, NP   150 mg at 07/18/23 2106   PTA Medications: Medications Prior to Admission  Medication Sig Dispense Refill Last Dose/Taking   ACCU-CHEK GUIDE test strip USE 1-4 TIMES DAILY AS DIRECTED BY PHYSICIAN TO MONITOR BLOOD SUGARS      albuterol  (VENTOLIN  HFA) 108 (90 Base) MCG/ACT inhaler Inhale 1-2 puffs into the lungs every 6 (six) hours as needed for wheezing or shortness of breath. 1 g 0    ARMOUR THYROID 120 MG tablet Take 120 mg by mouth every morning. (Patient not taking: Reported on 07/16/2023)      ASPIRIN  LOW DOSE 81 MG EC tablet Take 81 mg by mouth daily.      azithromycin  (ZITHROMAX  Z-PAK) 250 MG tablet Take 2 tablets on day 1 then 1 tablet daily (Patient not taking: Reported on 07/16/2023) 6 tablet 0     cetirizine (ZYRTEC) 10 MG tablet Take 10 mg by mouth daily.      DULoxetine  (CYMBALTA ) 30 MG capsule Take 60 mg by mouth daily.      DULoxetine  (CYMBALTA ) 60 MG capsule Take 60 mg by mouth daily.      fluticasone  (FLONASE ) 50 MCG/ACT nasal spray Place 2 sprays into both nostrils daily. 1 g 0    folic acid  (FOLVITE ) 1 MG tablet Take 1 tablet (1 mg total) by mouth daily. 90 tablet 3    gabapentin  (NEURONTIN ) 300 MG capsule Take 3 capsules by mouth daily.      ipratropium (ATROVENT ) 0.06 % nasal spray Place 2 sprays into both nostrils 4 (four) times daily. 15 mL 0    levonorgestrel (MIRENA) 20 MCG/DAY IUD by Intrauterine route.      levothyroxine  (SYNTHROID ) 50 MCG tablet Take 50 mcg by mouth daily.      linaclotide (LINZESS) 290 MCG CAPS capsule  (Patient not taking: Reported on 07/16/2023)      lisinopril  (ZESTRIL ) 10 MG tablet Take 1 tablet by mouth daily.      meloxicam (MOBIC) 15 MG tablet Take 15 mg by mouth daily.      metFORMIN  (GLUCOPHAGE ) 500 MG tablet Take 1 tablet (500 mg total) by mouth 2 (two) times daily with a meal. 60 tablet 1    metoprolol  succinate (TOPROL -XL) 25 MG 24 hr tablet Take 25 mg by mouth daily.      naproxen  sodium (ANAPROX  DS) 550 MG tablet Take 1 tablet (550 mg total) by mouth 2 (two) times daily with a meal. 30 tablet 0    norethindrone (MICRONOR) 0.35 MG tablet Take 1 tablet by mouth daily.      omeprazole (PRILOSEC) 20 MG capsule Take 1 capsule by mouth daily.      ondansetron  (ZOFRAN -ODT) 4 MG disintegrating tablet Take 1 tablet (4 mg total) by mouth every 6 (six) hours as needed for nausea or vomiting. 20 tablet 0    OZEMPIC, 0.25 OR 0.5 MG/DOSE, 2 MG/1.5ML SOPN Inject 0.5 mg into the skin once a week. (Patient not taking: Reported on 07/16/2023)      pantoprazole  (PROTONIX ) 40 MG tablet Take 1 tablet (40 mg total) by mouth daily. 30 tablet 1  phenol (CHLORASEPTIC GARGLE) 1.4 % LIQD Use as directed 1 spray in the mouth or throat as needed for throat irritation /  pain. 20 mL 0    potassium chloride  (KLOR-CON ) 10 MEQ tablet Take 1 tablet by mouth daily.      predniSONE  (STERAPRED UNI-PAK 21 TAB) 10 MG (21) TBPK tablet Take by mouth daily. Take 6 tabs by mouth daily for 1, then 5 tabs for 1 day, then 4 tabs for 1 day, then 3 tabs for 1 day, then 2 tabs for 1 day, then 1 tab for 1 day. (Patient not taking: Reported on 07/16/2023) 21 tablet 0    promethazine -dextromethorphan (PROMETHAZINE -DM) 6.25-15 MG/5ML syrup Take 5 mLs by mouth 4 (four) times daily as needed. 118 mL 0    Spacer/Aero-Holding Chambers (AEROCHAMBER MV) inhaler Use as instructed 1 each 1    thiamine  (VITAMIN B1) 100 MG tablet Take 1 tablet (100 mg total) by mouth daily. 90 tablet 3    tranexamic acid (LYSTEDA) 650 MG TABS tablet Take by mouth. (Patient not taking: Reported on 07/16/2023)      traZODone  (DESYREL ) 100 MG tablet Take 100 mg by mouth at bedtime.      traZODone  (DESYREL ) 50 MG tablet TAKE 1/2 TO 1 TABLET BY MOUTH EVERY NIGHT 1 HOUR BEFORE BEDTIME (Patient not taking: Reported on 07/16/2023)       Patient Stressors: Financial difficulties   Legal issue   Marital or family conflict   Medication change or noncompliance   Substance abuse    Patient Strengths: Ability for insight  Forensic Psychologist fund of knowledge  Motivation for treatment/growth  Supportive family/friends   Treatment Modalities: Medication Management, Group therapy, Case management,  1 to 1 session with clinician, Psychoeducation, Recreational therapy.   Physician Treatment Plan for Primary Diagnosis: Severe major depression, single episode, with psychotic features (HCC) Long Term Goal(s): Improvement in symptoms so as ready for discharge   Short Term Goals: Ability to identify changes in lifestyle to reduce recurrence of condition will improve Ability to verbalize feelings will improve Ability to disclose and discuss suicidal ideas Ability to demonstrate self-control will improve Ability to  identify and develop effective coping behaviors will improve Ability to maintain clinical measurements within normal limits will improve Compliance with prescribed medications will improve Ability to identify triggers associated with substance abuse/mental health issues will improve  Medication Management: Evaluate patient's response, side effects, and tolerance of medication regimen.  Therapeutic Interventions: 1 to 1 sessions, Unit Group sessions and Medication administration.  Evaluation of Outcomes: Progressing  Physician Treatment Plan for Secondary Diagnosis: Principal Problem:   Severe major depression, single episode, with psychotic features (HCC) Active Problems:   Suicidal ideation   Alcohol use disorder, severe, dependence (HCC)  Long Term Goal(s): Improvement in symptoms so as ready for discharge   Short Term Goals: Ability to identify changes in lifestyle to reduce recurrence of condition will improve Ability to verbalize feelings will improve Ability to disclose and discuss suicidal ideas Ability to demonstrate self-control will improve Ability to identify and develop effective coping behaviors will improve Ability to maintain clinical measurements within normal limits will improve Compliance with prescribed medications will improve Ability to identify triggers associated with substance abuse/mental health issues will improve     Medication Management: Evaluate patient's response, side effects, and tolerance of medication regimen.  Therapeutic Interventions: 1 to 1 sessions, Unit Group sessions and Medication administration.  Evaluation of Outcomes: Progressing   RN Treatment Plan for Primary  Diagnosis: Severe major depression, single episode, with psychotic features (HCC) Long Term Goal(s): Knowledge of disease and therapeutic regimen to maintain health will improve  Short Term Goals: Ability to remain free from injury will improve, Ability to verbalize  frustration and anger appropriately will improve, Ability to demonstrate self-control, Ability to participate in decision making will improve, Ability to verbalize feelings will improve, Ability to disclose and discuss suicidal ideas, Ability to identify and develop effective coping behaviors will improve, and Compliance with prescribed medications will improve  Medication Management: RN will administer medications as ordered by provider, will assess and evaluate patient's response and provide education to patient for prescribed medication. RN will report any adverse and/or side effects to prescribing provider.  Therapeutic Interventions: 1 on 1 counseling sessions, Psychoeducation, Medication administration, Evaluate responses to treatment, Monitor vital signs and CBGs as ordered, Perform/monitor CIWA, COWS, AIMS and Fall Risk screenings as ordered, Perform wound care treatments as ordered.  Evaluation of Outcomes: Progressing   LCSW Treatment Plan for Primary Diagnosis: Severe major depression, single episode, with psychotic features (HCC) Long Term Goal(s): Safe transition to appropriate next level of care at discharge, Engage patient in therapeutic group addressing interpersonal concerns.  Short Term Goals: Engage patient in aftercare planning with referrals and resources, Increase social support, Increase ability to appropriately verbalize feelings, Increase emotional regulation, Facilitate acceptance of mental health diagnosis and concerns, Facilitate patient progression through stages of change regarding substance use diagnoses and concerns, Identify triggers associated with mental health/substance abuse issues, and Increase skills for wellness and recovery  Therapeutic Interventions: Assess for all discharge needs, 1 to 1 time with Social worker, Explore available resources and support systems, Assess for adequacy in community support network, Educate family and significant other(s) on suicide  prevention, Complete Psychosocial Assessment, Interpersonal group therapy.  Evaluation of Outcomes: Progressing   Progress in Treatment: Attending groups: Yes. Participating in groups: Yes. Taking medication as prescribed: Yes. Toleration medication: Yes. Family/Significant other contact made: No, will contact:  if given permission. Patient understands diagnosis: Yes. Discussing patient identified problems/goals with staff: Yes. Medical problems stabilized or resolved: Yes. Denies suicidal/homicidal ideation: Yes. Issues/concerns per patient self-inventory: No. Other: none.  New problem(s) identified: No, Describe:  none identified.  New Short Term/Long Term Goal(s): detox, medication management for mood stabilization; elimination of SI thoughts; development of comprehensive mental wellness/sobriety plan.  Patient Goals:  Trying to get my mind right, get to feeling better.  Discharge Plan or Barriers: CSW will assist pt with development of an appropriate aftercare/discharge plan.   Reason for Continuation of Hospitalization: Anxiety Depression Medication stabilization Suicidal ideation Withdrawal symptoms  Estimated Length of Stay: 1-7 days  Last 3 [place] Suicide Severity Risk Score: Flowsheet Row Admission (Current) from 07/17/2023 in [facility] INPATIENT BE[facility] ED from 07/16/2023 in [facility] Emergency Department at [facility] ED from 07/03/2023 in [facility] Emergency Department at [facility]  C-SSRS RISK CATEGORY Error: Q3, 4, or 5 should not be populated when Q2 is No Low Risk No Risk       Last PHQ 2/9 Scores:     No data to display          Scribe for Treatment Team: Nadara JONELLE Fam, LCSW 07/19/2023 10:12 AM

## 2023-07-19 NOTE — Progress Notes (Signed)
 [facility] MD Progress Note  07/19/2023 8:22 PM Angela Landry  MRN:  [id number] Subjective:  43 year old African American female,During Interdisciplinary Team (IDT) meeting, the patient expressed concerns about treatment duration states, My family wants me to go into a 2-year treatment program, but I want 6 months.Reports feeling unwanted and burdensome, stating, My family says they are tired of me.Anxiety related to family dynamics and treatment expectations.Potential feelings of rejection or abandonmentatient reports difficulty swallowing, stating, It feels like something is stuck in my throat. The patient appears anxious regarding this symptom and reports previous evaluation by a GI specialist with a recommendation for further follow-up to rule out possible cancer.Patient denies choking, coughing, or difficulty breathing but expresses concern about worsening symptoms. Reports throat pain and discomfort when eating or drinking.Patient remains on CIWA protocol and is pending medical consultation to further evaluate symptoms. Encouraged patient to engage in relaxation techniques to manage anxiety related to health concerns. Principal Problem: Severe major depression, single episode, with psychotic features (HCC) Diagnosis: Principal Problem:   Severe major depression, single episode, with psychotic features (HCC) Active Problems:   Suicidal ideation   Alcohol use disorder, severe, dependence (HCC)  Total Time spent with patient: 1.5 hours  Past Psychiatric History: MDD  Past Medical History:  Past Medical History:  Diagnosis Date   Diabetes mellitus without complication (HCC)    Fibromyalgia    GERD (gastroesophageal reflux disease)    IBS (irritable bowel syndrome)    Thyroid disease    History reviewed. No pertinent surgical history. Family History: History reviewed. No pertinent family history. Family Psychiatric  History: none reported Social History:  Social History   Substance and  Sexual Activity  Alcohol Use Yes     Social History   Substance and Sexual Activity  Drug Use Never    Social History   Socioeconomic History   Marital status: Single    Spouse name: Not on file   Number of children: Not on file   Years of education: Not on file   Highest education level: Not on file  Occupational History   Not on file  Tobacco Use   Smoking status: Every Day    Types: Cigarettes   Smokeless tobacco: Never  Vaping Use   Vaping status: Never Used  Substance and Sexual Activity   Alcohol use: Yes   Drug use: Never   Sexual activity: Not on file  Other Topics Concern   Not on file  Social History Narrative   Not on file   Social Drivers of Health   Financial Resource Strain: Low Risk  (07/30/2022)   Received from [facility], [facility]   Overall Financial Resource Strain (CARDIA)    Difficulty of Paying Living Expenses: Not hard at all  Food Insecurity: Food Insecurity Present (07/17/2023)   Hunger Vital Sign    Worried About Running Out of Food in the Last Year: Sometimes true    Ran Out of Food in the Last Year: Sometimes true  Transportation Needs: Unmet Transportation Needs (07/17/2023)   PRAPARE - Transportation    Lack of Transportation (Medical): Yes    Lack of Transportation (Non-Medical): Yes  Physical Activity: Not on file  Stress: Stress Concern Present (01/04/2021)   Received from [facility], [facility]   [employer] [facility] - Occupational Stress Questionnaire    Feeling of Stress : Very much  Social Connections: Unknown (10/15/2021)   Received from [facility], [facility]   Social Network  Social Network: Not on file   Additional Social History:                         Sleep: Fair  Appetite:  Good  Current Medications: Current Facility-Administered Medications  Medication Dose Route Frequency Provider Last Rate Last Admin   acetaminophen  (TYLENOL ) tablet 650 mg  650 mg Oral Q6H  PRN Saucier, Dorn Ruth, NP       alum & mag hydroxide-simeth (MAALOX/MYLANTA) 200-200-20 MG/5ML suspension 30 mL  30 mL Oral Q4H PRN Saucier, Dorn Ruth, NP       chlordiazePOXIDE  (LIBRIUM ) capsule 25 mg  25 mg Oral Q6H PRN Nicholaus Brad RAMAN, NP   25 mg at 07/19/23 0235   [START ON 07/20/2023] chlordiazePOXIDE  (LIBRIUM ) capsule 25 mg  25 mg Oral Letha Nicholaus Brad RAMAN, NP       Followed by   NOREEN ON 07/21/2023] chlordiazePOXIDE  (LIBRIUM ) capsule 25 mg  25 mg Oral Daily Nicholaus Brad RAMAN, NP       haloperidol  lactate (HALDOL ) injection 5 mg  5 mg Intramuscular TID PRN Nicholaus Brad RAMAN, NP       And   diphenhydrAMINE  (BENADRYL ) injection 50 mg  50 mg Intramuscular TID PRN Nicholaus Brad RAMAN, NP       And   LORazepam  (ATIVAN ) injection 2 mg  2 mg Intramuscular TID PRN Nicholaus Brad RAMAN, NP       DULoxetine  (CYMBALTA ) DR capsule 60 mg  60 mg Oral QHS Nicholaus Brad RAMAN, NP   60 mg at 07/18/23 2106   gabapentin  (NEURONTIN ) capsule 100 mg  100 mg Oral Daily Nicholaus Brad RAMAN, NP   100 mg at 07/19/23 9063   gabapentin  (NEURONTIN ) capsule 400 mg  400 mg Oral QHS Marlaine Arey S, NP   400 mg at 07/18/23 2106   ibuprofen  (ADVIL ) tablet 600 mg  600 mg Oral Q8H PRN Saucier, Dorn Ruth, NP       levothyroxine  (SYNTHROID ) tablet 50 mcg  50 mcg Oral Q0600 Darrelle Barrell S, NP   50 mcg at 07/19/23 9375   loperamide  (IMODIUM ) capsule 2-4 mg  2-4 mg Oral PRN Nicholaus Brad RAMAN, NP       magnesium  hydroxide (MILK OF MAGNESIA) suspension 30 mL  30 mL Oral Daily PRN Saucier, Dorn Ruth, NP       melatonin tablet 5 mg  5 mg Oral QHS Nicholaus Brad RAMAN, NP   5 mg at 07/18/23 2106   menthol -cetylpyridinium (CEPACOL) lozenge 3 mg  1 lozenge Oral PRN Nicholaus Brad RAMAN, NP       metFORMIN  (GLUCOPHAGE ) tablet 500 mg  500 mg Oral BID AC Raydel Hosick S, NP   500 mg at 07/19/23 1615   metoprolol  succinate (TOPROL -XL) 24 hr tablet 25 mg  25 mg Oral Daily Champagne Paletta S, NP   25 mg at 07/19/23 0937   multivitamin with minerals tablet 1 tablet  1 tablet Oral Daily  Nyoka Alcoser S, NP   1 tablet at 07/19/23 9062   nicotine  (NICODERM CQ  - dosed in mg/24 hours) patch 21 mg  21 mg Transdermal Daily Parmar, Meenakshi, MD   21 mg at 07/19/23 0936   ondansetron  (ZOFRAN -ODT) disintegrating tablet 4 mg  4 mg Oral Q6H PRN La Dibella S, NP   4 mg at 07/19/23 1216   pantoprazole  (PROTONIX ) EC tablet 40 mg  40 mg Oral Daily Rayona Sardinha S, NP   40 mg at 07/19/23 [phone]  thiamine  (VITAMIN B1) tablet 100 mg  100 mg Oral Daily Nicholaus Brad RAMAN, NP   100 mg at 07/19/23 9063   traZODone  (DESYREL ) tablet 150 mg  150 mg Oral QHS Jolena Kittle S, NP   150 mg at 07/18/23 2106    Lab Results:  Results for orders placed or performed during the hospital encounter of 07/17/23 (from the past 48 hours)  Hemoglobin A1c     Status: Abnormal   Collection Time: 07/18/23  7:41 AM  Result Value Ref Range   Hgb A1c MFr Bld 8.4 (H) 4.8 - 5.6 %    Comment: (NOTE) Pre diabetes:          5.7%-6.4%  Diabetes:              >6.4%  Glycemic control for   <7.0% adults with diabetes    Mean Plasma Glucose 194.38 mg/dL    Comment: Performed at [facility], [address], [place], [place] [address]  Lipid panel     Status: None   Collection Time: 07/18/23  7:41 AM  Result Value Ref Range   Cholesterol 145 0 - 200 mg/dL   Triglycerides 61 <849 mg/dL   HDL 76 >59 mg/dL   Total CHOL/HDL Ratio 1.9 RATIO   VLDL 12 0 - 40 mg/dL   LDL Cholesterol 57 0 - 99 mg/dL    Comment:        Total Cholesterol/HDL:CHD Risk Coronary Heart Disease Risk Table                     Men   Women  1/2 Average Risk   3.4   3.3  Average Risk       5.0   4.4  2 X Average Risk   9.6   7.1  3 X Average Risk  23.4   11.0        Use the calculated Patient Ratio above and the CHD Risk Table to determine the patient's CHD Risk.        ATP III CLASSIFICATION (LDL):  <100     mg/dL   Optimal  899-870  mg/dL   Near or Above                    Optimal  130-159  mg/dL   Borderline  839-810  mg/dL   High  >809      mg/dL   Very High Performed at [facility], [address] [place], [place] [address]   Glucose, capillary     Status: Abnormal   Collection Time: 07/18/23  7:54 AM  Result Value Ref Range   Glucose-Capillary 161 (H) 70 - 99 mg/dL    Comment: Glucose reference range applies only to samples taken after fasting for at least 8 hours.   Comment 1 Notify RN   Glucose, capillary     Status: Abnormal   Collection Time: 07/18/23  4:21 PM  Result Value Ref Range   Glucose-Capillary 144 (H) 70 - 99 mg/dL    Comment: Glucose reference range applies only to samples taken after fasting for at least 8 hours.   Comment 1 Notify RN   Glucose, capillary     Status: Abnormal   Collection Time: 07/18/23  9:05 PM  Result Value Ref Range   Glucose-Capillary 174 (H) 70 - 99 mg/dL    Comment: Glucose reference range applies only to samples taken after fasting for at least 8 hours.  Glucose, capillary     Status: Abnormal   Collection Time: 07/19/23  8:15 AM  Result Value Ref Range   Glucose-Capillary 167 (H) 70 - 99 mg/dL    Comment: Glucose reference range applies only to samples taken after fasting for at least 8 hours.  Glucose, capillary     Status: Abnormal   Collection Time: 07/19/23 11:09 AM  Result Value Ref Range   Glucose-Capillary 166 (H) 70 - 99 mg/dL    Comment: Glucose reference range applies only to samples taken after fasting for at least 8 hours.  Glucose, capillary     Status: Abnormal   Collection Time: 07/19/23  4:11 PM  Result Value Ref Range   Glucose-Capillary 156 (H) 70 - 99 mg/dL    Comment: Glucose reference range applies only to samples taken after fasting for at least 8 hours.   Comment 1 Notify RN     Blood Alcohol level:  Lab Results  Component Value Date   ETH 363 (HH) 07/16/2023   ETH 225 (H) 07/03/2023    Metabolic Disorder Labs: Lab Results  Component Value Date   HGBA1C 8.4 (H) 07/18/2023   MPG 194.38 07/18/2023   No results found for:  PROLACTIN Lab Results  Component Value Date   CHOL 145 07/18/2023   TRIG 61 07/18/2023   HDL 76 07/18/2023   CHOLHDL 1.9 07/18/2023   VLDL 12 07/18/2023   LDLCALC 57 07/18/2023    Physical Findings: AIMS: Facial and Oral Movements Muscles of Facial Expression: Minimal, may be extreme normal Lips and Perioral Area: Minimal, may be extreme normal Jaw: Minimal, may be extreme normal Tongue: None,Extremity Movements Upper (arms, wrists, hands, fingers): Mild Lower (legs, knees, ankles, toes): Mild, Trunk Movements Neck, shoulders, hips: Mild, Global Judgements Severity of abnormal movements overall : Minimal, may be extreme normal Incapacitation due to abnormal movements: None Patient's awareness of abnormal movements: No Awareness, Dental Status Does patient usually wear dentures?: No Edentia?: No  CIWA:  CIWA-Ar Total: 1 COWS:     Musculoskeletal: Strength & Muscle Tone: within normal limits Gait & Station: normal Patient leans: N/A  Psychiatric Specialty Exam:  Presentation  General Appearance:  Disheveled (exhibits poor hygiene.)  Eye Contact: Minimal (She is cooperative during the interview but displays psychomotor retardation.)  Speech: Slow (delayed responses.)  Speech Volume: Decreased  Handedness: Right   Mood and Affect  Mood: Depressed  Affect: Flat; Tearful   Thought Process  Thought Processes: Coherent (exhibits significant thought blocking.)  Descriptions of Associations:Intact  Orientation:Partial  Thought Content:Scattered; Paranoid Ideation (believing that others are plotting against her.)  History of Schizophrenia/Schizoaffective disorder:No  Duration of Psychotic Symptoms:Less than six months  Hallucinations:Hallucinations: Auditory Description of Auditory Hallucinations: people are talking about me  Ideas of Reference:Paranoia  Suicidal Thoughts:Suicidal Thoughts: No  Homicidal Thoughts:Homicidal Thoughts:  No   Sensorium  Memory: Immediate Fair; Recent Fair; Remote Fair  Judgment: Poor (continued alcohol use despite negative consequences and non-compliance with previous treatment recommendations.)  Insight: Poor (into her condition, attributing her experiences solely to external stressors.)   Executive Functions  Concentration: Poor  Attention Span: Poor  Recall: [employer] of Knowledge: Fair  Language: Good   Psychomotor Activity  Psychomotor Activity: Psychomotor Activity: Psychomotor Retardation   Assets  Assets: Social Support; Housing   Sleep  Sleep: Sleep: Fair Number of Hours of Sleep: 5    Physical Exam: Physical Exam Vitals and nursing note reviewed.  Constitutional:      Appearance: Normal  appearance.  HENT:     Head: Normocephalic and atraumatic.     Nose: Nose normal.  Pulmonary:     Effort: Pulmonary effort is normal.  Musculoskeletal:     Cervical back: Normal range of motion.  Neurological:     General: No focal deficit present.     Mental Status: She is alert and oriented to person, place, and time. Mental status is at baseline.  Psychiatric:        Attention and Perception: Attention and perception normal.        Mood and Affect: Mood is anxious and depressed. Affect is flat.        Speech: Speech normal.        Behavior: Behavior normal. Behavior is cooperative.        Thought Content: Thought content normal.        Cognition and Memory: Cognition and memory normal.    Review of Systems  HENT:  Positive for sore throat.   Psychiatric/Behavioral:  Positive for depression and substance abuse. The patient is nervous/anxious.   All other systems reviewed and are negative.  Blood pressure (!) 141/86, pulse 87, temperature (!) 97.5 F (36.4 C), resp. rate 16, height 5' 5 (1.651 m), weight 94.8 kg, SpO2 100%. Body mass index is 34.78 kg/m.   Treatment Plan Summary: Daily contact with patient to assess and evaluate symptoms  and progress in treatment and Medication management CIWA-Libruim protocol, with a tapering schedule to mitigate withdrawal symptoms. Duloxetine  (Cymbalta ) 60 mg daily for major depressive disorder and generalized anxiety disorder Gabapentin  100 mg daily and 400 mg at night.an adjunct for seizure management. Pantoprazole  (Protonix ) 40 mg daily for gastroesophageal reflux disease Metoprolol  Succinate (Toprol  XL) 25 mg daily Beta-blocker used for hypertension Multivitamin (MVI) supplement dietary intake and prevent deficiencies. Thiamine  100 mg daily prevent Wernicke's encephalopathy, especially in patients with a history of alcohol use disorder Encourage a balanced diet to support overall health and recovery. Monitor nutritional status, especially considering the patient's alcohol use history Brad GORMAN Moats, NP 07/19/2023, 8:22 PM

## 2023-07-20 DIAGNOSIS — F323 Major depressive disorder, single episode, severe with psychotic features: Secondary | ICD-10-CM | POA: Diagnosis not present

## 2023-07-20 LAB — GLUCOSE, CAPILLARY
Glucose-Capillary: 162 mg/dL — ABNORMAL HIGH (ref 70–99)
Glucose-Capillary: 141 mg/dL — ABNORMAL HIGH (ref 70–99)

## 2023-07-20 MED ORDER — PHENOL 1.4 % MT LIQD: 1.0000 | OROMUCOSAL | Status: DC | PRN

## 2023-07-20 MED ORDER — GLUCERNA SHAKE PO LIQD
237.0000 mL | Freq: Two times a day (BID) | ORAL | Status: DC
  Administered 2023-07-24: 237 mL via ORAL

## 2023-07-20 MED ORDER — ALBUTEROL SULFATE HFA 108 (90 BASE) MCG/ACT IN AERS: 2.0000 | INHALATION_SPRAY | RESPIRATORY_TRACT | Status: DC | PRN

## 2023-07-20 MED ORDER — POTASSIUM CHLORIDE 20 MEQ PO PACK
20.0000 meq | PACK | Freq: Every day | ORAL | Status: DC
  Administered 2023-07-20 – 2023-07-23 (×4): 20 meq via ORAL
  Filled 2023-07-20 (×4): qty 1

## 2023-07-20 MED ORDER — ASPIRIN 81 MG PO TBEC
81.0000 mg | DELAYED_RELEASE_TABLET | Freq: Every day | ORAL | Status: DC
  Administered 2023-07-21 – 2023-07-24 (×4): 81 mg via ORAL
  Filled 2023-07-20 (×4): qty 1

## 2023-07-20 MED ORDER — FLUTICASONE PROPIONATE 50 MCG/ACT NA SUSP
2.0000 | Freq: Every day | NASAL | Status: DC
  Administered 2023-07-20 – 2023-07-24 (×5): 2 via NASAL
  Filled 2023-07-20: qty 16

## 2023-07-20 MED ORDER — LISINOPRIL 5 MG PO TABS
10.0000 mg | ORAL_TABLET | Freq: Every day | ORAL | Status: DC
  Administered 2023-07-20 – 2023-07-24 (×5): 10 mg via ORAL
  Filled 2023-07-20 (×5): qty 2

## 2023-07-20 MED ORDER — CHLORDIAZEPOXIDE HCL 25 MG PO CAPS
50.0000 mg | ORAL_CAPSULE | Freq: Once | ORAL | Status: AC
  Administered 2023-07-20: 50 mg via ORAL
  Filled 2023-07-20: qty 2

## 2023-07-20 MED ORDER — IPRATROPIUM BROMIDE 0.06 % NA SOLN
2.0000 | Freq: Four times a day (QID) | NASAL | Status: DC
Start: 2023-07-20 — End: 2023-07-24
  Administered 2023-07-20 – 2023-07-24 (×15): 2 via NASAL
  Filled 2023-07-20: qty 15

## 2023-07-20 MED ORDER — ALBUTEROL SULFATE (2.5 MG/3ML) 0.083% IN NEBU: 2.5000 mg | INHALATION_SOLUTION | RESPIRATORY_TRACT | Status: DC | PRN

## 2023-07-20 NOTE — Progress Notes (Signed)
   07/20/23 1039  Psych Admission Type (Psych Patients Only)  Admission Status Involuntary  Psychosocial Assessment  Patient Complaints None  Eye Contact Brief  Facial Expression Flat  Affect Flat  Speech Logical/coherent  Interaction Assertive  Motor Activity Slow  Appearance/Hygiene In scrubs  Behavior Characteristics Cooperative  Mood Pleasant  Thought Process  Coherency WDL  Content WDL  Delusions None reported or observed  Perception WDL  Hallucination None reported or observed  Judgment WDL  Confusion None  Danger to Self  Current suicidal ideation? Denies  Agreement Not to Harm Self Yes  Description of Agreement verbal  Danger to Others  Danger to Others None reported or observed

## 2023-07-20 NOTE — Group Note (Signed)
 Date:  07/20/2023 Time:  2:31 PM  Group Topic/Focus:  Healthy Communication:   The focus of this group is to discuss communication, barriers to communication, as well as healthy ways to communicate with others.    Participation Level:  Did Not Attend   Camellia HERO Dezman Granda 07/20/2023, 2:31 PM

## 2023-07-20 NOTE — Progress Notes (Signed)
   07/19/23 2100  Psych Admission Type (Psych Patients Only)  Admission Status Involuntary  Psychosocial Assessment  Patient Complaints None  Eye Contact Fair  Facial Expression Flat  Affect Preoccupied  Speech Logical/coherent  Interaction Assertive  Motor Activity Slow  Appearance/Hygiene In scrubs  Behavior Characteristics Cooperative  Mood Pleasant  Thought Process  Coherency WDL  Content WDL  Delusions None reported or observed  Perception WDL  Hallucination None reported or observed  Judgment WDL  Confusion None  Danger to Self  Current suicidal ideation? Denies  Agreement Not to Harm Self Yes  Description of Agreement verbal  Danger to Others  Danger to Others None reported or observed   Patient denies SI/HI/AVH interacting appropriately with peers and staff, affect is congruent , 15 minutes safety checks maintained, will continue to monitor.

## 2023-07-20 NOTE — Progress Notes (Addendum)
 [facility] MD Progress Note  07/20/2023 4:06 PM Angela Landry  MRN:  [id number] Subjective:  43 yo African American female was observed actively searching for inpatient treatment centers and requested assistance with FMLA paperwork for medical leave. Patient was redirected to speak with the LCSW for support with placement and resources.Patient expressed willingness to continue researching rehabilitation options and stated they plan to contact [facility] [employer] in the morning for further inquiries. During the shift, patient experienced an episode of anxiety, presenting with increased restlessness and distress. CIWA protocol was administered accordingly, and the patient was monitored for withdrawal symptoms and stabilization. Principal Problem: Severe major depression, single episode, with psychotic features (HCC) Diagnosis: Principal Problem:   Severe major depression, single episode, with psychotic features (HCC) Active Problems:   Suicidal ideation   Alcohol use disorder, severe, dependence (HCC)  Total Time spent with patient: 1 hour  Past Psychiatric History: Depression ETOH Abuse  Past Medical History:  Past Medical History:  Diagnosis Date   Diabetes mellitus without complication (HCC)    Fibromyalgia    GERD (gastroesophageal reflux disease)    IBS (irritable bowel syndrome)    Thyroid disease    History reviewed. No pertinent surgical history. Family History: History reviewed. No pertinent family history. Family Psychiatric  History: none reported Social History:  Social History   Substance and Sexual Activity  Alcohol Use Yes     Social History   Substance and Sexual Activity  Drug Use Never    Social History   Socioeconomic History   Marital status: Single    Spouse name: Not on file   Number of children: Not on file   Years of education: Not on file   Highest education level: Not on file  Occupational History   Not on file  Tobacco Use   Smoking status: Every Day     Types: Cigarettes   Smokeless tobacco: Never  Vaping Use   Vaping status: Never Used  Substance and Sexual Activity   Alcohol use: Yes   Drug use: Never   Sexual activity: Not on file  Other Topics Concern   Not on file  Social History Narrative   Not on file   Social Drivers of Health   Financial Resource Strain: Low Risk  (07/30/2022)   Received from [facility], [facility]   Overall Financial Resource Strain (CARDIA)    Difficulty of Paying Living Expenses: Not hard at all  Food Insecurity: Food Insecurity Present (07/17/2023)   Hunger Vital Sign    Worried About Running Out of Food in the Last Year: Sometimes true    Ran Out of Food in the Last Year: Sometimes true  Transportation Needs: Unmet Transportation Needs (07/17/2023)   PRAPARE - Transportation    Lack of Transportation (Medical): Yes    Lack of Transportation (Non-Medical): Yes  Physical Activity: Not on file  Stress: Stress Concern Present (01/04/2021)   Received from [facility], [facility]   [employer] [facility] [employer] - Occupational Stress Questionnaire    Feeling of Stress : Very much  Social Connections: Unknown (10/15/2021)   Received from [facility], [facility]   Social Network    Social Network: Not on file   Additional Social History:                         Sleep: Good  Appetite:  Good  Current Medications: Current Facility-Administered Medications  Medication Dose Route Frequency Provider Last  Rate Last Admin   acetaminophen  (TYLENOL ) tablet 650 mg  650 mg Oral Q6H PRN Saucier, Dorn Ruth, NP       albuterol  (VENTOLIN  HFA) 108 (90 Base) MCG/ACT inhaler 2 puff  2 puff Inhalation Q4H PRN [name] Ruts, MD       alum & mag hydroxide-simeth (MAALOX/MYLANTA) 200-200-20 MG/5ML suspension 30 mL  30 mL Oral Q4H PRN Saucier, Dorn Ruth, NP       [START ON 07/21/2023] aspirin  EC tablet 81 mg  81 mg Oral Daily Nicholaus Brad RAMAN, NP       chlordiazePOXIDE  (LIBRIUM )  capsule 25 mg  25 mg Oral Q6H PRN Nicholaus Brad RAMAN, NP   25 mg at 07/19/23 2119   chlordiazePOXIDE  (LIBRIUM ) capsule 25 mg  25 mg Oral Letha Nicholaus Brad RAMAN, NP   25 mg at 07/20/23 9143   Followed by   NOREEN ON 07/21/2023] chlordiazePOXIDE  (LIBRIUM ) capsule 25 mg  25 mg Oral Daily Nicholaus Brad RAMAN, NP       haloperidol  lactate (HALDOL ) injection 5 mg  5 mg Intramuscular TID PRN Nicholaus Brad RAMAN, NP       And   diphenhydrAMINE  (BENADRYL ) injection 50 mg  50 mg Intramuscular TID PRN Nicholaus Brad RAMAN, NP       And   LORazepam  (ATIVAN ) injection 2 mg  2 mg Intramuscular TID PRN Nicholaus Brad RAMAN, NP       DULoxetine  (CYMBALTA ) DR capsule 60 mg  60 mg Oral QHS Nicholaus Brad RAMAN, NP   60 mg at 07/19/23 2119   [START ON 07/21/2023] feeding supplement (GLUCERNA SHAKE) (GLUCERNA SHAKE) liquid 237 mL  237 mL Oral BID BM Hamlet Lasecki S, NP       fluticasone  (FLONASE ) 50 MCG/ACT nasal spray 2 spray  2 spray Each Nare Daily Nicholaus Brad RAMAN, NP       gabapentin  (NEURONTIN ) capsule 100 mg  100 mg Oral Daily Nicholaus Brad RAMAN, NP   100 mg at 07/20/23 9143   gabapentin  (NEURONTIN ) capsule 400 mg  400 mg Oral QHS Jordana Dugue S, NP   400 mg at 07/19/23 2119   ibuprofen  (ADVIL ) tablet 600 mg  600 mg Oral Q8H PRN Saucier, Dorn Ruth, NP       ipratropium (ATROVENT ) 0.06 % nasal spray 2 spray  2 spray Each Nare QID Nicholaus Brad RAMAN, NP       levothyroxine  (SYNTHROID ) tablet 50 mcg  50 mcg Oral Q0600 Jeweliana Dudgeon S, NP   50 mcg at 07/19/23 9375   lisinopril  (ZESTRIL ) tablet 10 mg  10 mg Oral Daily Makila Colombe S, NP       loperamide  (IMODIUM ) capsule 2-4 mg  2-4 mg Oral PRN Nicholaus Brad RAMAN, NP       magnesium  hydroxide (MILK OF MAGNESIA) suspension 30 mL  30 mL Oral Daily PRN Saucier, Dorn Ruth, NP       melatonin tablet 5 mg  5 mg Oral QHS Nicholaus Brad RAMAN, NP   5 mg at 07/19/23 2119   menthol -cetylpyridinium (CEPACOL) lozenge 3 mg  1 lozenge Oral PRN Ranny Wiebelhaus S, NP   3 mg at 07/19/23 2120   metFORMIN  (GLUCOPHAGE ) tablet 500 mg  500 mg Oral BID  AC Mansoor Hillyard S, NP   500 mg at 07/20/23 9143   metoprolol  succinate (TOPROL -XL) 24 hr tablet 25 mg  25 mg Oral Daily Azarria Balint S, NP   25 mg at 07/20/23 0857   multivitamin with minerals tablet 1  tablet  1 tablet Oral Daily Andalyn Heckstall S, NP   1 tablet at 07/20/23 9143   nicotine  (NICODERM CQ  - dosed in mg/24 hours) patch 21 mg  21 mg Transdermal Daily Parmar, Meenakshi, MD   21 mg at 07/20/23 0857   ondansetron  (ZOFRAN -ODT) disintegrating tablet 4 mg  4 mg Oral Q6H PRN Nicholaus Brad RAMAN, NP   4 mg at 07/20/23 9562   pantoprazole  (PROTONIX ) EC tablet 40 mg  40 mg Oral Daily Westley Blass S, NP   40 mg at 07/20/23 0856   phenol (CHLORASEPTIC) mouth spray 1 spray  1 spray Mouth/Throat PRN Nicholaus Brad RAMAN, NP       potassium chloride  (KLOR-CON ) packet 20 mEq  20 mEq Oral QHS Deonna Krummel S, NP       thiamine  (VITAMIN B1) tablet 100 mg  100 mg Oral Daily Nicholaus Brad RAMAN, NP   100 mg at 07/20/23 9142   traZODone  (DESYREL ) tablet 150 mg  150 mg Oral QHS Filemon Breton S, NP   150 mg at 07/19/23 2119    Lab Results:  Results for orders placed or performed during the hospital encounter of 07/17/23 (from the past 48 hours)  Glucose, capillary     Status: Abnormal   Collection Time: 07/18/23  4:21 PM  Result Value Ref Range   Glucose-Capillary 144 (H) 70 - 99 mg/dL    Comment: Glucose reference range applies only to samples taken after fasting for at least 8 hours.   Comment 1 Notify RN   Glucose, capillary     Status: Abnormal   Collection Time: 07/18/23  9:05 PM  Result Value Ref Range   Glucose-Capillary 174 (H) 70 - 99 mg/dL    Comment: Glucose reference range applies only to samples taken after fasting for at least 8 hours.  Glucose, capillary     Status: Abnormal   Collection Time: 07/19/23  8:15 AM  Result Value Ref Range   Glucose-Capillary 167 (H) 70 - 99 mg/dL    Comment: Glucose reference range applies only to samples taken after fasting for at least 8 hours.  Glucose, capillary     Status:  Abnormal   Collection Time: 07/19/23 11:09 AM  Result Value Ref Range   Glucose-Capillary 166 (H) 70 - 99 mg/dL    Comment: Glucose reference range applies only to samples taken after fasting for at least 8 hours.  Glucose, capillary     Status: Abnormal   Collection Time: 07/19/23  4:11 PM  Result Value Ref Range   Glucose-Capillary 156 (H) 70 - 99 mg/dL    Comment: Glucose reference range applies only to samples taken after fasting for at least 8 hours.   Comment 1 Notify RN   Glucose, capillary     Status: Abnormal   Collection Time: 07/19/23  8:21 PM  Result Value Ref Range   Glucose-Capillary 165 (H) 70 - 99 mg/dL    Comment: Glucose reference range applies only to samples taken after fasting for at least 8 hours.  Glucose, capillary     Status: Abnormal   Collection Time: 07/20/23  8:12 AM  Result Value Ref Range   Glucose-Capillary 162 (H) 70 - 99 mg/dL    Comment: Glucose reference range applies only to samples taken after fasting for at least 8 hours.    Blood Alcohol level:  Lab Results  Component Value Date   ETH [facility]) 07/16/2023   ETH 225 (H) 07/03/2023    Metabolic Disorder  Labs: Lab Results  Component Value Date   HGBA1C 8.4 (H) 07/18/2023   MPG 194.38 07/18/2023   No results found for: PROLACTIN Lab Results  Component Value Date   CHOL 145 07/18/2023   TRIG 61 07/18/2023   HDL 76 07/18/2023   CHOLHDL 1.9 07/18/2023   VLDL 12 07/18/2023   LDLCALC 57 07/18/2023    Physical Findings: AIMS: Facial and Oral Movements Muscles of Facial Expression: Minimal, may be extreme normal Lips and Perioral Area: Minimal, may be extreme normal Jaw: Minimal, may be extreme normal Tongue: None,Extremity Movements Upper (arms, wrists, hands, fingers): Mild Lower (legs, knees, ankles, toes): Mild, Trunk Movements Neck, shoulders, hips: Mild, Global Judgements Severity of abnormal movements overall : Minimal, may be extreme normal Incapacitation due to abnormal  movements: None Patient's awareness of abnormal movements: No Awareness, Dental Status Does patient usually wear dentures?: No Edentia?: No  CIWA:  CIWA-Ar Total: 7 COWS:     Musculoskeletal: Strength & Muscle Tone: within normal limits Gait & Station: normal Patient leans: N/A  Psychiatric Specialty Exam:  Presentation  General Appearance:  Appropriate for Environment  Eye Contact: Good  Speech: Clear and Coherent; Normal Rate  Speech Volume: Normal  Handedness: Right   Mood and Affect  Mood: Anxious  Affect: Appropriate   Thought Process  Thought Processes: Coherent; Goal Directed  Descriptions of Associations:Intact  Orientation:Full (Time, Place and Person)  Thought Content:Logical; WDL  History of Schizophrenia/Schizoaffective disorder:No  Duration of Psychotic Symptoms:N/A  Hallucinations:Hallucinations: None Description of Auditory Hallucinations: denies  Ideas of Reference:None  Suicidal Thoughts:Suicidal Thoughts: No  Homicidal Thoughts:Homicidal Thoughts: No   Sensorium  Memory: Immediate Good; Recent Good; Remote Good  Judgment: Fair  Insight: Fair   Art Therapist  Concentration: Fair  Attention Span: Fair  Recall: Good  Fund of Knowledge: Good  Language: Good   Psychomotor Activity  Psychomotor Activity: Psychomotor Activity: Normal   Assets  Assets: Communication Skills; Housing; Health And Safety Inspector; Resilience   Sleep  Sleep: Sleep: Good Number of Hours of Sleep: 6    Physical Exam: Physical Exam Vitals and nursing note reviewed.  Constitutional:      Appearance: Normal appearance.  HENT:     Head: Normocephalic and atraumatic.     Nose: Nose normal.  Pulmonary:     Effort: Pulmonary effort is normal.  Musculoskeletal:        General: Normal range of motion.     Cervical back: Normal range of motion.  Neurological:     Mental Status: She is alert.  Psychiatric:         Attention and Perception: Attention and perception normal.        Mood and Affect: Mood is anxious. Affect is flat.        Speech: Speech normal.        Behavior: Behavior normal. Behavior is cooperative.        Thought Content: Thought content normal.        Cognition and Memory: Cognition and memory normal.        Judgment: Judgment normal.    Review of Systems  HENT:  Positive for sore throat.   Neurological:  Positive for tremors.  Psychiatric/Behavioral:  Positive for substance abuse. The patient is nervous/anxious.   All other systems reviewed and are negative.  Blood pressure 110/87, pulse 98, temperature 98 F (36.7 C), resp. rate 20, height 5' 5 (1.651 m), weight 94.8 kg, SpO2 96%. Body mass index is 34.78 kg/m.   Treatment  Plan Summary: Daily contact with patient to assess and evaluate symptoms and progress in treatment and Medication management Coordination with LCSW for inpatient placement options and FMLA paperwork assistance. Continued monitoring of anxiety levels and withdrawal symptoms with CIWA protocol. Encouragement to follow through with contacting rehab facilities and supporting patient's motivation for treatment. Follow-up on patient's progress with placement and symptom management  ent CIWA-Libruim protocol, with a tapering schedule to mitigate withdrawal symptoms. Duloxetine  (Cymbalta ) 60 mg daily for major depressive disorder and generalized anxiety disorder Gabapentin  100 mg daily and 400 mg at night.an adjunct for seizure management. Pantoprazole  (Protonix ) 40 mg daily for gastroesophageal reflux disease Metoprolol  Succinate (Toprol  XL) 25 mg daily Beta-blocker used for hypertension Multivitamin (MVI) supplement dietary intake and prevent deficiencies. Thiamine  100 mg daily prevent Wernicke's encephalopathy, especially in patients with a history of alcohol use disorder Encourage a balanced diet to support overall health and recovery. Monitor  nutritional status, especially considering the patient's alcohol use history.  Brad GORMAN Moats, NP 07/20/2023, 4:06 PM

## 2023-07-20 NOTE — Group Note (Signed)
 Date:  07/20/2023 Time:  2:36 PM  Group Topic/Focus:  Activity Group: The focus of this group is to promote activity for the patients and encourage them to go outside to the courtyard for some fresh air and some exercise.    Participation Level:  Active  Participation Quality:  Appropriate  Affect:  Appropriate  Cognitive:  Appropriate  Insight: Appropriate  Engagement in Group:  Engaged  Modes of Intervention:  Activity  Additional Comments:    Camellia HERO Sheldon Sem 07/20/2023, 2:36 PM

## 2023-07-20 NOTE — Plan of Care (Signed)
  Problem: Education: Goal: Verbalization of understanding the information provided will improve Outcome: Progressing   Problem: Education: Goal: Mental status will improve Outcome: Progressing

## 2023-07-21 DIAGNOSIS — F323 Major depressive disorder, single episode, severe with psychotic features: Secondary | ICD-10-CM | POA: Diagnosis not present

## 2023-07-21 DIAGNOSIS — R1319 Other dysphagia: Secondary | ICD-10-CM | POA: Diagnosis not present

## 2023-07-21 LAB — GLUCOSE, CAPILLARY
Glucose-Capillary: 204 mg/dL — ABNORMAL HIGH (ref 70–99)
Glucose-Capillary: 185 mg/dL — ABNORMAL HIGH (ref 70–99)

## 2023-07-21 MED ORDER — LORAZEPAM 0.5 MG PO TABS
0.5000 mg | ORAL_TABLET | Freq: Every day | ORAL | Status: AC
  Administered 2023-07-22 – 2023-07-23 (×2): 0.5 mg via ORAL
  Filled 2023-07-21 (×2): qty 1

## 2023-07-21 MED ORDER — SUCRALFATE 1 GM/10ML PO SUSP
1.0000 g | Freq: Three times a day (TID) | ORAL | Status: DC
  Administered 2023-07-21 – 2023-07-24 (×12): 1 g via ORAL
  Filled 2023-07-21 (×12): qty 10

## 2023-07-21 MED ORDER — METHOCARBAMOL 500 MG PO TABS
500.0000 mg | ORAL_TABLET | Freq: Two times a day (BID) | ORAL | Status: DC
  Administered 2023-07-21 – 2023-07-24 (×7): 500 mg via ORAL
  Filled 2023-07-21 (×7): qty 1

## 2023-07-21 MED ORDER — METFORMIN HCL 500 MG PO TABS
1000.0000 mg | ORAL_TABLET | Freq: Two times a day (BID) | ORAL | Status: DC
  Administered 2023-07-21 – 2023-07-24 (×7): 1000 mg via ORAL
  Filled 2023-07-21 (×6): qty 2

## 2023-07-21 MED ORDER — PANTOPRAZOLE SODIUM 40 MG PO TBEC
40.0000 mg | DELAYED_RELEASE_TABLET | Freq: Two times a day (BID) | ORAL | Status: DC
  Administered 2023-07-21 – 2023-07-24 (×7): 40 mg via ORAL
  Filled 2023-07-21 (×7): qty 1

## 2023-07-21 MED ORDER — HYDROXYZINE HCL 50 MG PO TABS
50.0000 mg | ORAL_TABLET | Freq: Four times a day (QID) | ORAL | Status: DC | PRN
  Administered 2023-07-22: 50 mg via ORAL
  Filled 2023-07-21: qty 1

## 2023-07-21 NOTE — Group Note (Signed)
 Date:  07/21/2023 Time:  10:20 AM  Group Topic/Focus:  Goals Group:   The focus of this group is to help patients establish daily goals to achieve during treatment and discuss how the patient can incorporate goal setting into their daily lives to aide in recovery. Healthy Communication:   The focus of this group is to discuss communication, barriers to communication, as well as healthy ways to communicate with others. Making Healthy Choices:   The focus of this group is to help patients identify negative/unhealthy choices they were using prior to admission and identify positive/healthier coping strategies to replace them upon discharge.    Participation Level:  Active  Participation Quality:  Appropriate  Affect:  Appropriate  Cognitive:  Alert, Appropriate, and Oriented  Insight: Appropriate  Engagement in Group:  Developing/Improving and Engaged  Modes of Intervention:  Activity, Discussion, and Education  Additional Comments:    Angela Landry 07/21/2023, 10:20 AM

## 2023-07-21 NOTE — Group Note (Signed)
 Date:  07/21/2023 Time:  11:08 PM  Group Topic/Focus:  Wrap-Up Group:   The focus of this group is to help patients review their daily goal of treatment and discuss progress on daily workbooks.    Participation Level:  Did Not Attend  Maglione,Foxx Klarich E 07/21/2023, 11:08 PM

## 2023-07-21 NOTE — Consult Note (Addendum)
 Initial Consultation Note   Patient: Angela Landry FMW:[id number] DOB: 1981/05/06 PCP: [facility] Primary DOA: 07/17/2023 DOS: the patient was seen and examined on 07/21/2023 Primary service: [name] Ruts, MD  Referring physician: Brad Moats Reason for consult: Trouble swallowing  Assessment/Plan:  Dysphagia -Tactically, dysphagia symptoms are at laryngal phase, as evidenced by recent EMG study laryngoscope showing inflamed vocal cord and laryngeal tissue.  However, the main etiology appears to be severe GERD and distal esophageal stenosis. The distal esophageal stenosis likely related to poorly controlled GERD but also malignancy needs to be ruled out.  Noted that patient already has a referral to see [facility] for elective endoscopy.  Patient has pain associated with swallowing solid food but likely from inflamed laryngal tissue from poorly controlled GERD which was confirmed with ENT in the recent laryngoscope done about 2 weeks ago.  And patient was referred to see [facility].  No odynophagia or weight loss as per patient. -For now we will increase her PPI to twice daily and start patient on Carafate  3 times daily -Stay upright at least 30 minutes after eating meals -Head of bed 30 degrees when sleeping if possible -Outpatient follow-up with [facility] ASAP -D/C Ibuprofen  and other NSAIDS -Avoid alcohol containing beverage -Stop cigar smoking -No further workup needed during this hospitalization.  IIDM -Most recent A1c 8.4 -Continue with metformin , increase to 1000 mg twice daily, repeat A1c in 3 months  HTN -Fairly controlled, continue lisinopril   Diabetic neuropathy -Stable, continue gabapentin    TRH will sign off at present, please call us  again when needed.  HPI: Angela Landry is a 43 y.o. female with past medical history of poorly controlled GERD, who started to develop swallowing problems about 3 months ago.  Patient reported sore throat in the morning and trouble to swallow  chunky solid food, and she has to chew the food small pieces to swallow despite she still feel painful swallowing at  throat level denied any chest pain associated with swallowing, no significant weight loss.  She went to see [facility] 2 weeks ago who underwent bronchoscopy, results showed that the patient has significant inflammation of vocal cord and surrounding area indicating poorly controlled GERD and reflux.  Patient is referred to see [facility] for elective endoscopy.  Review of Systems: As mentioned in the history of present illness. All other systems reviewed and are negative. Past Medical History:  Diagnosis Date   Diabetes mellitus without complication (HCC)    Fibromyalgia    GERD (gastroesophageal reflux disease)    IBS (irritable bowel syndrome)    Thyroid disease    History reviewed. No pertinent surgical history. Social History:  reports that she has been smoking cigarettes. She has never used smokeless tobacco. She reports current alcohol use. She reports that she does not use drugs.  No Known Allergies  History reviewed. No pertinent family history.  Prior to Admission medications   Medication Sig Start Date End Date Taking? Authorizing Provider  ACCU-CHEK GUIDE test strip USE 1-4 TIMES DAILY AS DIRECTED BY PHYSICIAN TO MONITOR BLOOD SUGARS 10/10/20   [provider]  albuterol  (VENTOLIN  HFA) 108 (90 Base) MCG/ACT inhaler Inhale 1-2 puffs into the lungs every 6 (six) hours as needed for wheezing or shortness of breath. 06/12/23   Kingston Robes, PA-C  ARMOUR THYROID 120 MG tablet Take 120 mg by mouth every morning. Patient not taking: Reported on 07/16/2023 10/10/20   [provider]  ASPIRIN  LOW DOSE 81 MG EC tablet Take  81 mg by mouth daily. 10/10/20   [provider]  azithromycin  (ZITHROMAX  Z-PAK) 250 MG tablet Take 2 tablets on day 1 then 1 tablet daily Patient not taking: Reported on 07/16/2023 06/09/23   Brimage, Vondra, DO  cetirizine (ZYRTEC) 10 MG  tablet Take 10 mg by mouth daily.    [provider]  DULoxetine  (CYMBALTA ) 30 MG capsule Take 60 mg by mouth daily. 03/20/23   [provider]  DULoxetine  (CYMBALTA ) 60 MG capsule Take 60 mg by mouth daily. 10/10/20   [provider]  fluticasone  (FLONASE ) 50 MCG/ACT nasal spray Place 2 sprays into both nostrils daily. 06/19/23 06/18/24  Cyrena Mylar, MD  folic acid  (FOLVITE ) 1 MG tablet Take 1 tablet (1 mg total) by mouth daily. 07/03/23 07/02/24  Ward, Josette SAILOR, DO  gabapentin  (NEURONTIN ) 300 MG capsule Take 3 capsules by mouth daily. 08/08/20   [provider]  ipratropium (ATROVENT ) 0.06 % nasal spray Place 2 sprays into both nostrils 4 (four) times daily. 04/02/23   Mortenson, Ashley, MD  levonorgestrel (MIRENA) 20 MCG/DAY IUD by Intrauterine route.    [provider]  levothyroxine  (SYNTHROID ) 50 MCG tablet Take 50 mcg by mouth daily.    [provider]  linaclotide LARUE) 290 MCG CAPS capsule  12/08/15   [provider]  lisinopril  (ZESTRIL ) 10 MG tablet Take 1 tablet by mouth daily. 03/20/23 03/19/24  [provider]  meloxicam (MOBIC) 15 MG tablet Take 15 mg by mouth daily. 12/06/15 03/19/24  [provider]  metFORMIN  (GLUCOPHAGE ) 500 MG tablet Take 1 tablet (500 mg total) by mouth 2 (two) times daily with a meal. 07/03/23 09/01/23  Ward, Josette SAILOR, DO  metoprolol  succinate (TOPROL -XL) 25 MG 24 hr tablet Take 25 mg by mouth daily.    [provider]  naproxen  sodium (ANAPROX  DS) 550 MG tablet Take 1 tablet (550 mg total) by mouth 2 (two) times daily with a meal. 10/15/21   White, Shelba SAUNDERS, NP  norethindrone (MICRONOR) 0.35 MG tablet Take 1 tablet by mouth daily. 02/08/23 02/08/24  [provider]  omeprazole (PRILOSEC) 20 MG capsule Take 1 capsule by mouth daily. 03/22/23 03/21/24  [provider]  ondansetron  (ZOFRAN -ODT) 4 MG disintegrating tablet Take 1 tablet (4 mg total) by mouth every 6 (six)  hours as needed for nausea or vomiting. 07/03/23   Ward, Josette N, DO  OZEMPIC, 0.25 OR 0.5 MG/DOSE, 2 MG/1.5ML SOPN Inject 0.5 mg into the skin once a week. Patient not taking: Reported on 07/16/2023 10/10/20   [provider]  pantoprazole  (PROTONIX ) 40 MG tablet Take 1 tablet (40 mg total) by mouth daily. 07/03/23 07/02/24  Ward, Josette N, DO  phenol (CHLORASEPTIC GARGLE) 1.4 % LIQD Use as directed 1 spray in the mouth or throat as needed for throat irritation / pain. 05/28/23   Bradler, Evan K, MD  potassium chloride  (KLOR-CON ) 10 MEQ tablet Take 1 tablet by mouth daily. 06/14/23 06/12/24  [provider]  predniSONE  (STERAPRED UNI-PAK 21 TAB) 10 MG (21) TBPK tablet Take by mouth daily. Take 6 tabs by mouth daily for 1, then 5 tabs for 1 day, then 4 tabs for 1 day, then 3 tabs for 1 day, then 2 tabs for 1 day, then 1 tab for 1 day. Patient not taking: Reported on 07/16/2023 06/09/23   Brimage, Vondra, DO  promethazine -dextromethorphan (PROMETHAZINE -DM) 6.25-15 MG/5ML syrup Take 5 mLs by mouth 4 (four) times daily as needed. 06/09/23   Brimage, Vondra, DO  Spacer/Aero-Holding Chambers (AEROCHAMBER MV) inhaler Use as instructed 04/02/23   Van Knee, MD  thiamine  (VITAMIN B1) 100 MG tablet Take 1 tablet (100 mg total) by mouth daily. 07/03/23   Ward, Josette SAILOR, DO  tranexamic acid (LYSTEDA) 650 MG TABS tablet Take by mouth. Patient not taking: Reported on 07/16/2023 02/08/23   [provider]  traZODone  (DESYREL ) 100 MG tablet Take 100 mg by mouth at bedtime. 03/20/23   [provider]  traZODone  (DESYREL ) 50 MG tablet TAKE 1/2 TO 1 TABLET BY MOUTH EVERY NIGHT 1 HOUR BEFORE BEDTIME Patient not taking: Reported on 07/16/2023 12/09/20   [provider]    Physical Exam: Vitals:   07/20/23 1243 07/20/23 1748 07/21/23 0612 07/21/23 1652  BP: 110/87 (!) 125/94 127/76 123/78  Pulse: 98 89 86 88  Resp:   (!) 22 17  Temp:  98.3 F (36.8 C) 98.4 F (36.9 C) 97.7 F  (36.5 C)  TempSrc:      SpO2:  100% 97% 100%  Weight:      Height:       Eyes: PERRL, lids and conjunctivae normal ENMT: Mucous membranes are moist. Posterior pharynx clear of any exudate or lesions.Normal dentition.  Neck: normal, supple, no masses, no thyromegaly Respiratory: clear to auscultation bilaterally, no wheezing, no crackles. Normal respiratory effort. No accessory muscle use.  Cardiovascular: Regular rate and rhythm, no murmurs / rubs / gallops. No extremity edema. 2+ pedal pulses. No carotid bruits.  Abdomen: no tenderness, no masses palpated. No hepatosplenomegaly. Bowel sounds positive.  Musculoskeletal: no clubbing / cyanosis. No joint deformity upper and lower extremities. Good ROM, no contractures. Normal muscle tone.  Skin: no rashes, lesions, ulcers. No induration Neurologic: CN 2-12 grossly intact. Sensation intact, DTR normal.  Muscle strength 5/5 on both sides Psychiatric: Normal judgment and insight. Alert and oriented x 3. Normal mood.    Data Reviewed:  Record of remote EGD results done in 2018 revealed  Family Communication: none  Primary team communication:  Thank you very much for involving us  in the care of your patient.  Author: Cort ONEIDA Mana, MD 07/21/2023 5:11 PM  For on call review [email or web address].

## 2023-07-21 NOTE — Progress Notes (Signed)
 Patient has been observed making phone calls. Patient stated to this writer that her anxiety is high trying to find a place to go. Patient was given PRN medication for agitation/anxiety to help with relief.   07/21/23 1047  Complaints & Interventions  Complains of Agitation;Anxiety  Interventions Medication (see MAR)  Neuro symptoms relieved by Anti-anxiety medication

## 2023-07-21 NOTE — Group Note (Signed)
 Date:  07/21/2023 Time:  9:09 PM  Group Topic/Focus:  Wellness Toolbox:   The focus of this group is to discuss various aspects of wellness, balancing those aspects and exploring ways to increase the ability to experience wellness.  Patients will create a wellness toolbox for use upon discharge. Wrap-Up Group:   The focus of this group is to help patients review their daily goal of treatment and discuss progress on daily workbooks.    Participation Level:  Active  Participation Quality:  Appropriate and Attentive  Affect:  Appropriate  Cognitive:  Alert  Insight: Appropriate and Good  Engagement in Group:  Engaged  Modes of Intervention:  Discussion  Additional Comments:     Maglione,Stelios Kirby E 07/21/2023, 9:09 PM

## 2023-07-21 NOTE — Progress Notes (Signed)
   07/21/23 1800  CIWA-Ar  Nausea and Vomiting 0  Tactile Disturbances 0  Tremor 1  Auditory Disturbances 0  Paroxysmal Sweats 0  Visual Disturbances 0  Anxiety 0  Headache, Fullness in Head 0 (patient is asleep after receiving Tylenol )  Agitation 0  Orientation and Clouding of Sensorium 0  CIWA-Ar Total 1

## 2023-07-21 NOTE — Progress Notes (Signed)
   07/21/23 1200  CIWA-Ar  Nausea and Vomiting 0  Tactile Disturbances 0  Tremor 1  Auditory Disturbances 0  Paroxysmal Sweats 0  Visual Disturbances 0  Anxiety 1  Headache, Fullness in Head 0  Agitation 0  Orientation and Clouding of Sensorium 0  CIWA-Ar Total 2

## 2023-07-21 NOTE — Plan of Care (Signed)
  Problem: Education: Goal: Knowledge of [facility] Education information/materials will improve Outcome: Progressing Goal: Emotional status will improve Outcome: Progressing Goal: Mental status will improve Outcome: Progressing Goal: Verbalization of understanding the information provided will improve Outcome: Progressing   Problem: Activity: Goal: Interest or engagement in activities will improve Outcome: Progressing Goal: Sleeping patterns will improve Outcome: Progressing   Problem: Coping: Goal: Ability to verbalize frustrations and anger appropriately will improve Outcome: Progressing Goal: Ability to demonstrate self-control will improve Outcome: Progressing   Problem: Health Behavior/Discharge Planning: Goal: Identification of resources available to assist in meeting health care needs will improve Outcome: Progressing Goal: Compliance with treatment plan for underlying cause of condition will improve Outcome: Progressing   Problem: Physical Regulation: Goal: Ability to maintain clinical measurements within normal limits will improve Outcome: Progressing   Problem: Safety: Goal: Periods of time without injury will increase Outcome: Progressing   Problem: Coping: Goal: Ability to identify and develop effective coping behavior will improve Outcome: Progressing   Problem: Education: Goal: Knowledge of disease or condition will improve Outcome: Progressing   Problem: Physical Regulation: Goal: Complications related to the disease process, condition or treatment will be avoided or minimized Outcome: Progressing

## 2023-07-21 NOTE — Group Note (Signed)
 Date:  07/21/2023 Time:  5:44 PM  Group Topic/Focus:  Outdoor recreation  therapy  structured activity    Participation Level:  Active  Participation Quality:  Appropriate  Affect:  Appropriate  Cognitive:  Alert and Appropriate  Insight: Appropriate  Engagement in Group:  Developing/Improving  Modes of Intervention:  Activity, Discussion, and Socialization  Additional Comments:    Lurlean Niece 07/21/2023, 5:44 PM

## 2023-07-21 NOTE — Progress Notes (Signed)
 New orders were placed for 1,000 mg of Metformin , after patient already received 500 mg at 1724. This Clinical research associate only gave another 500 mg to make the increased dose of 1,000 mg.

## 2023-07-21 NOTE — Progress Notes (Signed)
 Visible in the milieu, interacting with selective peers.  I am doing so, so.  I am trying to find a place to live,  Spoke to my family; I am frustrated; they are not supportive.  Nobody wants to talk to me.  Pt endorsed depression and anxiety both rated moderately severe, and expressed being afraid of the unknown.  She denied SI/HI, and AVH.  Took all scheduled medication, and remains care compliant.

## 2023-07-21 NOTE — Progress Notes (Addendum)
 [facility] MD Progress Note  07/21/2023 3:41 PM Angela Landry  MRN:  [id number] Subjective:  43 year old African American female reports feeling off and extremely agitated. Atarax  50mg  ordered as needed for agitation.Patient also complains of sore throat and difficulty swallowing. Medical consult requested for further evaluation. Head and tremors observed during assessment.Patient remains alert and oriented but appears uncomfortable. Continued monitoring for symptom progression and response to medication. Encouraged to report any worsening symptom Principal Problem: Severe major depression, single episode, with psychotic features (HCC) Diagnosis: Principal Problem:   Severe major depression, single episode, with psychotic features (HCC) Active Problems:   Suicidal ideation   Alcohol use disorder, severe, dependence (HCC)  Total Time spent with patient: 1.5 hours  Past Psychiatric History: Depression  Past Medical History:  Past Medical History:  Diagnosis Date   Diabetes mellitus without complication (HCC)    Fibromyalgia    GERD (gastroesophageal reflux disease)    IBS (irritable bowel syndrome)    Thyroid disease    History reviewed. No pertinent surgical history. Family History: History reviewed. No pertinent family history. Family Psychiatric  History: none reported Social History:  Social History   Substance and Sexual Activity  Alcohol Use Yes     Social History   Substance and Sexual Activity  Drug Use Never    Social History   Socioeconomic History   Marital status: Single    Spouse name: Not on file   Number of children: Not on file   Years of education: Not on file   Highest education level: Not on file  Occupational History   Not on file  Tobacco Use   Smoking status: Every Day    Types: Cigarettes   Smokeless tobacco: Never  Vaping Use   Vaping status: Never Used  Substance and Sexual Activity   Alcohol use: Yes   Drug use: Never   Sexual activity:  Not on file  Other Topics Concern   Not on file  Social History Narrative   Not on file   Social Drivers of Health   Financial Resource Strain: Low Risk  (07/30/2022)   Received from [facility], [facility]   Overall Financial Resource Strain (CARDIA)    Difficulty of Paying Living Expenses: Not hard at all  Food Insecurity: Food Insecurity Present (07/17/2023)   Hunger Vital Sign    Worried About Running Out of Food in the Last Year: Sometimes true    Ran Out of Food in the Last Year: Sometimes true  Transportation Needs: Unmet Transportation Needs (07/17/2023)   PRAPARE - Transportation    Lack of Transportation (Medical): Yes    Lack of Transportation (Non-Medical): Yes  Physical Activity: Not on file  Stress: Stress Concern Present (01/04/2021)   Received from [facility], [facility]   [employer] [facility]ational [facility] - Occupational Stress Questionnaire    Feeling of Stress : Very much  Social Connections: Unknown (10/15/2021)   Received from [facility], [facility]   Social Network    Social Network: Not on file   Additional Social History:                         Sleep: Fair  Appetite:  Good  Current Medications: Current Facility-Administered Medications  Medication Dose Route Frequency Provider Last Rate Last Admin   acetaminophen  (TYLENOL ) tablet 650 mg  650 mg Oral Q6H PRN [place] Dorn Ruth, NP   650 mg at 07/21/23 0856   albuterol  (  VENTOLIN  HFA) 108 (90 Base) MCG/ACT inhaler 2 puff  2 puff Inhalation Q4H PRN Parmar, Meenakshi, MD       alum & mag hydroxide-simeth (MAALOX/MYLANTA) 200-200-20 MG/5ML suspension 30 mL  30 mL Oral Q4H PRN Saucier, Dorn Ruth, NP       aspirin  EC tablet 81 mg  81 mg Oral Daily Nicholaus Brad RAMAN, NP   81 mg at 07/21/23 9145   haloperidol  lactate (HALDOL ) injection 5 mg  5 mg Intramuscular TID PRN Nicholaus Brad RAMAN, NP   5 mg at 07/21/23 1047   And   diphenhydrAMINE  (BENADRYL ) injection 50 mg  50 mg  Intramuscular TID PRN Nicholaus Brad RAMAN, NP   50 mg at 07/21/23 1047   And   LORazepam  (ATIVAN ) injection 2 mg  2 mg Intramuscular TID PRN Nicholaus Brad RAMAN, NP   2 mg at 07/21/23 1047   DULoxetine  (CYMBALTA ) DR capsule 60 mg  60 mg Oral QHS Nyhla Mountjoy S, NP   60 mg at 07/20/23 2108   feeding supplement (GLUCERNA SHAKE) (GLUCERNA SHAKE) liquid 237 mL  237 mL Oral BID BM Viva Gallaher S, NP       fluticasone  (FLONASE ) 50 MCG/ACT nasal spray 2 spray  2 spray Each Nare Daily Nicholaus Brad RAMAN, NP   2 spray at 07/21/23 0900   gabapentin  (NEURONTIN ) capsule 100 mg  100 mg Oral Daily Nicholaus Brad RAMAN, NP   100 mg at 07/21/23 9145   gabapentin  (NEURONTIN ) capsule 400 mg  400 mg Oral QHS Lynore Coscia S, NP   400 mg at 07/20/23 2110   hydrOXYzine  (ATARAX ) tablet 50 mg  50 mg Oral Q6H PRN Nicholaus Brad RAMAN, NP       ibuprofen  (ADVIL ) tablet 600 mg  600 mg Oral Q8H PRN Saucier, Dorn Ruth, NP   600 mg at 07/21/23 1042   ipratropium (ATROVENT ) 0.06 % nasal spray 2 spray  2 spray Each Nare QID Nicholaus Brad RAMAN, NP   2 spray at 07/21/23 0900   levothyroxine  (SYNTHROID ) tablet 50 mcg  50 mcg Oral Q0600 Jenna Routzahn S, NP   50 mcg at 07/21/23 9441   lisinopril  (ZESTRIL ) tablet 10 mg  10 mg Oral Daily Nicholaus Brad RAMAN, NP   10 mg at 07/21/23 9145   magnesium  hydroxide (MILK OF MAGNESIA) suspension 30 mL  30 mL Oral Daily PRN Saucier, Dorn Ruth, NP       melatonin tablet 5 mg  5 mg Oral QHS Nicholaus Brad RAMAN, NP   5 mg at 07/20/23 2109   menthol -cetylpyridinium (CEPACOL) lozenge 3 mg  1 lozenge Oral PRN Nicholaus Brad RAMAN, NP   3 mg at 07/20/23 2112   metFORMIN  (GLUCOPHAGE ) tablet 500 mg  500 mg Oral BID AC Remi Rester S, NP   500 mg at 07/21/23 9145   methocarbamol  (ROBAXIN ) tablet 500 mg  500 mg Oral BID Nicholaus Brad RAMAN, NP       metoprolol  succinate (TOPROL -XL) 24 hr tablet 25 mg  25 mg Oral Daily Donyetta Ogletree S, NP   25 mg at 07/21/23 9144   multivitamin with minerals tablet 1 tablet  1 tablet Oral Daily Nicholaus Brad RAMAN, NP   1 tablet at 07/21/23  0854   nicotine  (NICODERM CQ  - dosed in mg/24 hours) patch 21 mg  21 mg Transdermal Daily Parmar, Meenakshi, MD   21 mg at 07/21/23 0855   pantoprazole  (PROTONIX ) EC tablet 40 mg  40 mg Oral Daily Nicholaus Brad RAMAN, NP  40 mg at 07/21/23 0854   phenol (CHLORASEPTIC) mouth spray 1 spray  1 spray Mouth/Throat PRN Nicholaus Brad RAMAN, NP       potassium chloride  (KLOR-CON ) packet 20 mEq  20 mEq Oral QHS Charlane Westry S, NP   20 mEq at 07/20/23 2113   thiamine  (VITAMIN B1) tablet 100 mg  100 mg Oral Daily Nicholaus Brad RAMAN, NP   100 mg at 07/21/23 9145   traZODone  (DESYREL ) tablet 150 mg  150 mg Oral QHS Nehemiah Mcfarren S, NP   150 mg at 07/20/23 2109    Lab Results:  Results for orders placed or performed during the hospital encounter of 07/17/23 (from the past 48 hours)  Glucose, capillary     Status: Abnormal   Collection Time: 07/19/23  4:11 PM  Result Value Ref Range   Glucose-Capillary 156 (H) 70 - 99 mg/dL    Comment: Glucose reference range applies only to samples taken after fasting for at least 8 hours.   Comment 1 Notify RN   Glucose, capillary     Status: Abnormal   Collection Time: 07/19/23  8:21 PM  Result Value Ref Range   Glucose-Capillary 165 (H) 70 - 99 mg/dL    Comment: Glucose reference range applies only to samples taken after fasting for at least 8 hours.  Glucose, capillary     Status: Abnormal   Collection Time: 07/20/23  8:12 AM  Result Value Ref Range   Glucose-Capillary 162 (H) 70 - 99 mg/dL    Comment: Glucose reference range applies only to samples taken after fasting for at least 8 hours.  Glucose, capillary     Status: Abnormal   Collection Time: 07/20/23  7:56 PM  Result Value Ref Range   Glucose-Capillary 141 (H) 70 - 99 mg/dL    Comment: Glucose reference range applies only to samples taken after fasting for at least 8 hours.    Blood Alcohol level:  Lab Results  Component Value Date   ETH 363 (HH) 07/16/2023   ETH 225 (H) 07/03/2023    Metabolic Disorder  Labs: Lab Results  Component Value Date   HGBA1C 8.4 (H) 07/18/2023   MPG 194.38 07/18/2023   No results found for: PROLACTIN Lab Results  Component Value Date   CHOL 145 07/18/2023   TRIG 61 07/18/2023   HDL 76 07/18/2023   CHOLHDL 1.9 07/18/2023   VLDL 12 07/18/2023   LDLCALC 57 07/18/2023    Physical Findings: AIMS: Facial and Oral Movements Muscles of Facial Expression: Minimal, may be extreme normal Lips and Perioral Area: Minimal, may be extreme normal Jaw: Minimal, may be extreme normal Tongue: None,Extremity Movements Upper (arms, wrists, hands, fingers): Mild Lower (legs, knees, ankles, toes): Mild, Trunk Movements Neck, shoulders, hips: Mild, Global Judgements Severity of abnormal movements overall : Minimal, may be extreme normal Incapacitation due to abnormal movements: None Patient's awareness of abnormal movements: No Awareness, Dental Status Does patient usually wear dentures?: No Edentia?: No  CIWA:  CIWA-Ar Total: 2 COWS:     Musculoskeletal: Strength & Muscle Tone: within normal limits Gait & Station: normal Patient leans: N/A  Psychiatric Specialty Exam:  Presentation  General Appearance:  Appropriate for Environment  Eye Contact: Good  Speech: Clear and Coherent; Normal Rate  Speech Volume: Normal  Handedness: Right   Mood and Affect  Mood: Anxious  Affect: Appropriate   Thought Process  Thought Processes: Coherent; Goal Directed  Descriptions of Associations:Intact  Orientation:Full (Time, Place and Person)  Thought  Content:Logical; WDL  History of Schizophrenia/Schizoaffective disorder:No  Duration of Psychotic Symptoms:N/A  Hallucinations:Hallucinations: None Description of Auditory Hallucinations: denies  Ideas of Reference:None  Suicidal Thoughts:Suicidal Thoughts: No  Homicidal Thoughts:Homicidal Thoughts: No   Sensorium  Memory: Immediate Good; Recent Good; Remote  Good  Judgment: Fair  Insight: Fair   Art Therapist  Concentration: Fair  Attention Span: Fair  Recall: Good  Fund of Knowledge: Good  Language: Good   Psychomotor Activity  Psychomotor Activity: Psychomotor Activity: Normal   Assets  Assets: Communication Skills; Housing; Health And Safety Inspector; Resilience   Sleep  Sleep: Sleep: Good Number of Hours of Sleep: 6    Physical Exam: Physical Exam Vitals and nursing note reviewed.  HENT:     Head: Normocephalic.     Nose: Nose normal.     Mouth/Throat:     Pharynx: Posterior oropharyngeal erythema present.  Pulmonary:     Effort: Pulmonary effort is normal.  Musculoskeletal:        General: Normal range of motion.     Cervical back: Normal range of motion.  Neurological:     General: No focal deficit present.     Mental Status: She is oriented to person, place, and time. Mental status is at baseline.  Psychiatric:        Attention and Perception: Attention and perception normal.        Mood and Affect: Mood is anxious. Affect is flat.        Speech: Speech is delayed.        Behavior: Behavior normal. Behavior is cooperative.        Thought Content: Thought content normal.        Cognition and Memory: Cognition and memory normal.        Judgment: Judgment is impulsive.    Review of Systems  Neurological:  Positive for tremors.  Psychiatric/Behavioral:  Positive for depression and substance abuse. The patient is nervous/anxious and has insomnia.   All other systems reviewed and are negative.  Blood pressure 127/76, pulse 86, temperature 98.4 F (36.9 C), resp. rate (!) 22, height 5' 5 (1.651 m), weight 94.8 kg, SpO2 97%. Body mass index is 34.78 kg/m.   Treatment Plan Summary: Daily contact with patient to assess and evaluate symptoms and progress in treatment and Medication management Duloxetine  (Cymbalta ) 60 mg daily for major depressive disorder and generalized anxiety  disorder Gabapentin  100 mg daily and 400 mg at night.an adjunct for seizure management. Pantoprazole  (Protonix ) 40 mg daily for gastroesophageal reflux disease Metoprolol  Succinate (Toprol  XL) 25 mg daily Beta-blocker used for hypertension Multivitamin (MVI) supplement dietary intake and prevent deficiencies. Medical consult requested for further evaluation.  Thiamine  100 mg daily prevent Wernicke's encephalopathy, especially in patients with a history of alcohol use disorder Encourage a balanced diet to support overall health and recovery. Monitor nutritional status, especially considering the patient's alcohol use history Brad GORMAN Moats, NP 07/21/2023, 3:41 PM

## 2023-07-21 NOTE — Progress Notes (Signed)
   07/21/23 1100  Psych Admission Type (Psych Patients Only)  Admission Status Involuntary  Psychosocial Assessment  Patient Complaints Anxiety;Depression (patient states trying to find somewhere to go, it's kind of tearing up my nerves.)  Eye Contact Fair;Watchful  Facial Expression Anxious;Worried  Affect Anxious;Preoccupied  Arboriculturist  Appearance/Hygiene In Environmental Education Officer Cooperative;Appropriate to situation  Mood Anxious;Preoccupied;Pleasant  Aggressive Behavior  Effect No apparent injury  Thought Process  Coherency WDL  Content WDL  Delusions None reported or observed  Perception WDL  Hallucination None reported or observed  Judgment WDL  Confusion None  Danger to Self  Current suicidal ideation? Denies  Agreement Not to Harm Self Yes  Description of Agreement Verbal  Danger to Others  Danger to Others None reported or observed

## 2023-07-22 ENCOUNTER — Telehealth: Payer: Self-pay

## 2023-07-22 DIAGNOSIS — F323 Major depressive disorder, single episode, severe with psychotic features: Secondary | ICD-10-CM | POA: Diagnosis not present

## 2023-07-22 LAB — GLUCOSE, CAPILLARY
Glucose-Capillary: 143 mg/dL — ABNORMAL HIGH (ref 70–99)
Glucose-Capillary: 117 mg/dL — ABNORMAL HIGH (ref 70–99)
Glucose-Capillary: 151 mg/dL — ABNORMAL HIGH (ref 70–99)
Glucose-Capillary: 124 mg/dL — ABNORMAL HIGH (ref 70–99)

## 2023-07-22 MED ORDER — ARIPIPRAZOLE 5 MG PO TABS
5.0000 mg | ORAL_TABLET | Freq: Every day | ORAL | Status: DC
  Administered 2023-07-22 – 2023-07-24 (×3): 5 mg via ORAL
  Filled 2023-07-22 (×3): qty 1

## 2023-07-22 NOTE — Group Note (Signed)
 Recreation Therapy Group Note   Group Topic:Coping Skills  Group Date: 07/22/2023 Start Time: 1000 End Time: 1100 Facilitators: Deatrice Factor, LRT, CTRS Location:  Craft Room  Group Description: Mind Map.  Patient was provided a blank template of a diagram with 32 blank boxes in a tiered system, branching from the center (similar to a bubble chart). LRT directed patients to label the middle of the diagram "Coping Skills". LRT and patients then came up with 8 different coping skills as examples. Pt were directed to record their coping skills in the 2nd tier boxes closest to the center.  Patients would then share their coping skills with the group as LRT wrote them out. LRT gave a handout of 99 different coping skills at the end of group.   Goal Area(s) Addressed: Patients will be able to define "coping skills". Patient will identify new coping skills.  Patient will increase communication.   Affect/Mood: Appropriate   Participation Level: Active and Engaged   Participation Quality: Independent   Behavior: Appropriate, Calm, and Cooperative   Speech/Thought Process: Coherent   Insight: Good   Judgement: Good   Modes of Intervention: Clarification, Education, Guided Discussion, Open Conversation, and Worksheet   Patient Response to Interventions:  Attentive, Engaged, Interested , and Receptive   Education Outcome:  Acknowledges education   Clinical Observations/Individualized Feedback: Angela Landry was active in their participation of session activities and group discussion. Pt identified "self-care, paint my toe nails, clean, and do laundry" as coping skills. Pt came to group late, however, joined with no issue. Pt spontaneously contributed to group discussion while interacting well with LRT and peers duration of session.    Plan: Continue to engage patient in RT group sessions 2-3x/week.   Deatrice Factor, LRT, CTRS 07/22/2023 12:10 PM

## 2023-07-22 NOTE — Plan of Care (Signed)
   Problem: Education: Goal: Emotional status will improve Outcome: Progressing Goal: Mental status will improve Outcome: Progressing Goal: Verbalization of understanding the information provided will improve Outcome: Progressing

## 2023-07-22 NOTE — Group Note (Signed)
 [facility] LCSW Group Therapy Note    Group Date: 07/22/2023 Start Time: 1300 End Time: 1345  Type of Therapy and Topic:  Group Therapy:  Overcoming Obstacles  Participation Level:  [facility] PARTICIPATION LEVEL: Active   Description of Group:   In this group patients will be encouraged to explore what they see as obstacles to their own wellness and recovery. They will be guided to discuss their thoughts, feelings, and behaviors related to these obstacles. The group will process together ways to cope with barriers, with attention given to specific choices patients can make. Each patient will be challenged to identify changes they are motivated to make in order to overcome their obstacles. This group will be process-oriented, with patients participating in exploration of their own experiences as well as giving and receiving support and challenge from other group members.  Therapeutic Goals: 1. Patient will identify personal and current obstacles as they relate to admission. 2. Patient will identify barriers that currently interfere with their wellness or overcoming obstacles.  3. Patient will identify feelings, thought process and behaviors related to these barriers. 4. Patient will identify two changes they are willing to make to overcome these obstacles:    Summary of Patient Progress Patient was present for the entirety of the group process. She shared that losing her home is an potential obstacle that she is facing and would like to overcome. Pt stated that losing her voice caused an issue with her employment and being able to pay her bills. Gardening, being outside in the dirt, fishing, and walking were identified as things that are a helpful part of her toolbox. Pt appeared open and receptive to feedback/comments from both her peers and facilitator.   Therapeutic Modalities:   Cognitive Behavioral Therapy Solution Focused Therapy Motivational  Interviewing Relapse Prevention Therapy   Randolm Butte, LCSW
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
## 2023-07-22 NOTE — BHH Counselor (Signed)
 CSW faxed requested paperwork to [facility] at [phone].   Elvie Hammed. [name], MSW, LCSW, LCAS 07/22/2023 11:08 AM

## 2023-07-22 NOTE — BHH Counselor (Addendum)
 CSW faxed over referral paperwork to [facility] [phone]).   Elvie Hammed. [name], MSW, LCSW, LCAS 07/22/2023 2:47 PM  CSW contacted [facility] to follow up regarding receipt of referral paperwork. CSW was informed that pt had already completed phone screening and that they were just needed the referral information. CSW was informed that it needed to be faxed over to [phone]. No other concerns expressed. Contact ended without incident.   CSW faxed over referral information to the above mentioned number.   Elvie Hammed. [name], MSW, LCSW, LCAS 07/22/2023 3:38 PM

## 2023-07-22 NOTE — Group Note (Signed)
 Date:  07/22/2023 Time:  10:38 PM  Group Topic/Focus:  Orientation:   The focus of this group is to educate the patient on the purpose and policies of crisis stabilization and provide a format to answer questions about their admission.  The group details unit policies and expectations of patients while admitted.    Participation Level:  Active  Participation Quality:  Appropriate, Attentive, and Sharing  Affect:  Appropriate  Cognitive:  Alert and Appropriate  Insight: Appropriate and Good  Engagement in Group:  Developing/Improving and Engaged  Modes of Intervention:  Clarification, Discussion, Orientation, Rapport Building, and Support  Additional Comments:     Cottrell Gentles 07/22/2023, 10:38 PM

## 2023-07-22 NOTE — Group Note (Signed)
 Date:  07/22/2023 Time:  10:05 AM  Group Topic/Focus:  Goals Group:   The focus of this group is to help patients establish daily goals to achieve during treatment and discuss how the patient can incorporate goal setting into their daily lives to aide in recovery. Wellness Toolbox:   The focus of this group is to discuss various aspects of wellness, balancing those aspects and exploring ways to increase the ability to experience wellness.  Patients will create a wellness toolbox for use upon discharge.    Participation Level:  Active  Participation Quality:  Appropriate  Affect:  Appropriate  Cognitive:  Appropriate  Insight: Appropriate  Engagement in Group:  Developing/Improving and Engaged  Modes of Intervention:  Activity, Discussion, and Education  Additional Comments:    Angela Landry 07/22/2023, 10:05 AM

## 2023-07-22 NOTE — BHH Counselor (Signed)
 CSW met with pt briefly per request. She informed CSW that she had tried had made a number of contacts over the weekend. She gave CSW this information  [facility] will not take her due to active probation.   [facility]: weekend CSW sent over paperwork.  [facility]): pt requesting application.  [facility]: requested that medical records be sent over.   Pt shared that she plans to call [facility] later in the day.   No other concerns expressed. Contact ended without incident.   CSW contacted [facility] and was informed that they would need to have basic information to include pt's medication list and diagnosis. It was asked that pt give them a call back.   CSW contacted [facility] but was unable to establish contact. HIPAA compliant voicemail left with contact information for follow through.   [facility] application was printed and given to pt for completion.   Elvie Hammed. [name], MSW, LCSW, LCAS 07/22/2023 10:51 AM

## 2023-07-22 NOTE — Group Note (Signed)
 Recreation Therapy Group Note   Group Topic:Relaxation  Group Date: 07/22/2023 Start Time: 1530 End Time: 1610 Facilitators: Yvonna Herder, CTRS Location:  C[facility] Room  Group Description: Meditation. LRT and patients discussed what they know about meditation and mindfulness. LRT played a Deep Breathing Meditation exercise script for patients to follow along to. LRT and patients discussed how meditation and deep breathing can be used as a coping skill post--discharge to help manage symptoms of stress.   Goal Area(s) Addressed: Patient will practice using relaxation technique. Patient will identify a new coping skill.  Patient will follow multistep directions to reduce anxiety and stress.   Affect/Mood: Appropriate   Participation Level: Minimal    Clinical Observations/Individualized Feedback: Patient came to group with 5 minutes remaining.   Plan: Continue to engage patient in RT group sessions 2-3x/week.   Deatrice Factor, LRT, CTRS 07/22/2023 5:19 PM

## 2023-07-22 NOTE — Telephone Encounter (Signed)
 Patient sister is calling because she states that her sister is the mental ward at [facility]. She states that the doctor states that he needs a referral for a GI doctor to see her. Informed her if she is in the hospital then they will call the GI doctor on call to see her and they will come see her in the office. Informed her we were a outpatient clinic.

## 2023-07-22 NOTE — Inpatient Diabetes Management (Signed)
 Inpatient Diabetes Program Recommendations  AACE/ADA: New Consensus Statement on Inpatient Glycemic Control (2015)  Target Ranges:  Prepandial:   less than 140 mg/dL      Peak postprandial:   less than 180 mg/dL (1-2 hours)      Critically ill patients:  140 - 180 mg/dL   Lab Results  Component Value Date   GLUCAP 117 (H) 07/22/2023   HGBA1C 8.4 (H) 07/18/2023    Latest Reference Range & Units 07/20/23 08:12 07/20/23 19:56 07/21/23 16:34 07/21/23 20:00 07/22/23 08:05 07/22/23 12:00  Glucose-Capillary 70 - 99 mg/dL 161 (H) 096 (H) 045 (H) 185 (H) 143 (H) 117 (H)  (H): Data is abnormally high  Diabetes history: DM2 Outpatient Diabetes medications: Metformin  500 mg bid Current orders for Inpatient glycemic control: Metformin  1 gm bid  Inpatient Diabetes Program Recommendations:   Received consult regarding CGM. Patient's CBGs are doing well and only on Metformin  1 gm bid, so would refer to patient's PCP for future evaluation.  Thank you, Rosemaria Inabinet E. Carrera Kiesel, RN, MSN, CDCES  Diabetes Coordinator Inpatient Glycemic Control Team Team Pager [phone] (8am-5pm) 07/22/2023 12:50 PM

## 2023-07-22 NOTE — Progress Notes (Signed)
 For the most part of the shift pt was in bed.  OOB for medication.  Continues to endorse depression and anxiety, and express frustration that she has not been able to find a place.  Pt informed that she made multiple calls, one place denied her because she is on probation, and another won't be able to take her until after 45 days. C/o throat discomfort, Cepacol given.  She denied SI/HI AVH.

## 2023-07-23 DIAGNOSIS — F323 Major depressive disorder, single episode, severe with psychotic features: Secondary | ICD-10-CM | POA: Diagnosis not present

## 2023-07-23 LAB — GLUCOSE, CAPILLARY
Glucose-Capillary: 134 mg/dL — ABNORMAL HIGH (ref 70–99)
Glucose-Capillary: 111 mg/dL — ABNORMAL HIGH (ref 70–99)
Glucose-Capillary: 150 mg/dL — ABNORMAL HIGH (ref 70–99)
Glucose-Capillary: 108 mg/dL — ABNORMAL HIGH (ref 70–99)

## 2023-07-23 MED ORDER — GABAPENTIN 100 MG PO CAPS
100.0000 mg | ORAL_CAPSULE | ORAL | Status: AC
  Administered 2023-07-23: 100 mg via ORAL
  Filled 2023-07-23: qty 1

## 2023-07-23 NOTE — BHH Counselor (Signed)
CSW touched base with [facility] in [place], [place] at [phone]. CSW spoke with Shawna Orleans, admissions specialist who explained that team is requiring patient to be seen from a GI doctor. Notes state that patient has a GI appointment in April.   Results from visits/medical records must be faxed over following visit on patient's behalf.  Records to be faxed to [phone] once completed.   CSW touched based with [facility] at [phone]. It was confirmed that patient did not have a GI appointment in April, patient has a otolaryngology (ENT) appointment with Dr. Clelia Croft.   There are cancellations in [place], [place] and patient is able to schedule GI appointment for Thursday at 02/13 at 9, 10, 11 AM. CSW to touch base with patient and team to assess. Patient needs to call [facility] at  [phone] to confirm best time.    Reymundo Poll, MSW, LCSWA 07/23/2023 9:49 AM

## 2023-07-23 NOTE — Plan of Care (Signed)
  Problem: Education: Goal: Knowledge of [facility] Education information/materials will improve Outcome: Progressing Goal: Emotional status will improve Outcome: Progressing Goal: Mental status will improve Outcome: Progressing Goal: Verbalization of understanding the information provided will improve Outcome: Progressing   Problem: Activity: Goal: Interest or engagement in activities will improve Outcome: Progressing Goal: Sleeping patterns will improve Outcome: Progressing   Problem: Coping: Goal: Ability to verbalize frustrations and anger appropriately will improve Outcome: Progressing Goal: Ability to demonstrate self-control will improve Outcome: Progressing   Problem: Health Behavior/Discharge Planning: Goal: Identification of resources available to assist in meeting health care needs will improve Outcome: Progressing Goal: Compliance with treatment plan for underlying cause of condition will improve Outcome: Progressing   Problem: Physical Regulation: Goal: Ability to maintain clinical measurements within normal limits will improve Outcome: Progressing   Problem: Safety: Goal: Periods of time without injury will increase Outcome: Progressing   Problem: Coping: Goal: Ability to identify and develop effective coping behavior will improve Outcome: Progressing   Problem: Education: Goal: Knowledge of disease or condition will improve Outcome: Progressing   Problem: Physical Regulation: Goal: Complications related to the disease process, condition or treatment will be avoided or minimized Outcome: Progressing

## 2023-07-23 NOTE — Progress Notes (Signed)
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 She presented to the emergency department on July 16, 2023 at 4:52 PM, expressing suicidal ideation and feelings of depression.The patient reports a longstanding battle with depression, which has intensified over the past several months. She describes persistent feelings of sadness, hopelessness, and a lack of interest in previously enjoyable activities. Accompanying these depressive symptoms, she has experienced auditory hallucinations and paranoia, which she has attempted to self-medicate with increased alcohol consumption.   Subjective:  Chart reviewed, case discussed with multidisciplinary team, patient seen today on rounds. She denies SI/HI/AVH. Denies acute withdrawal symptoms. She is adherent with medication regimen. Start aripirazole and is tolerating well. Denies side effects. Complains of pain r/t sciatica.  Patient shares she has been accepted to [facility] in [place], [place] but is required to attend appointment with [facility] prior to admission. Social worker and patient have confirmed appointment availability for Thursday morning, 07/25/23. Discharge plan, safety planning, and relapse prevention discussed. Anticipate discharge 07/24/23 after 5pm to avoid isolation at home. Patient's friend is currently residing with her and will be home after work. Identifies friend as supportive and sober. Patient will take Benedetto Goad to/from appointment at [facility]. Once results are uploaded on mychart, patient will email [facility] and they will pick her up from home. She feels comfortable with this plan and is ready to get her life back on track.  Sleep: Fair Appetite:  Good  Principal Problem: Severe major depression, single episode, with psychotic features (HCC) Diagnosis: Principal Problem:   Severe  major depression, single episode, with psychotic features (HCC) Active Problems:   Suicidal ideation   Alcohol use disorder, severe, dependence (HCC)   Esophageal dysphagia  Total Time spent with patient: 20 min  Past Psychiatric History:  Diagnostic history: MDD, trauma (childhood and adult) Denies history of suicide attempts History of SAIOP in [place] Past trial of aripiprazole in 2024 (effective) Outpatient therapy with Jacky Kindle at [facility]  Past Medical History:  Past Medical History:  Diagnosis Date   Diabetes mellitus without complication (HCC)    Fibromyalgia    GERD (gastroesophageal reflux disease)    IBS (irritable bowel syndrome)    Thyroid disease     History reviewed. No pertinent surgical history.  Family History: History reviewed. No pertinent family history. Family Psychiatric  History: significant for substance use   Social History:  Social History   Substance and Sexual Activity  Alcohol Use Yes     Social History   Substance and Sexual Activity  Drug Use Never    Social History   Socioeconomic History   Marital status: Single    Spouse name: Not on file   Number of children: Not on file   Years of education: Not on file   Highest education level: Not on file  Occupational History   Not on file  Tobacco Use   Smoking status: Every Day    Types: Cigarettes   Smokeless tobacco: Never  Vaping Use   Vaping status: Never Used  Substance and Sexual Activity   Alcohol use: Yes   Drug use: Never   Sexual activity: Not on file  Other Topics Concern   Not on file  Social History Narrative   Not on file   Social Drivers of Health   Financial Resource Strain: Low Risk  (  07/30/2022)   Received [facility], [facility]   Overall Financial Resource Strain (CARDIA)    Difficulty of Paying Living Expenses: Not hard at all  Food Insecurity: Food Insecurity Present (07/17/2023)   Hunger Vital Sign    Worried About  Running Out of Food in the Last Year: Sometimes true    Ran Out of Food in the Last Year: Sometimes true  Transportation Needs: Unmet Transportation Needs (07/17/2023)   PRAPARE - Administrator, Civil Service (Medical): Yes    Lack of Transportation (Non-Medical): Yes  Physical Activity: Not on file  Stress: Stress Concern Present (01/04/2021)   Received from [facility], [facility]   [employer] of Occupational Health - Occupational Stress Questionnaire    Feeling of Stress : Very much  Social Connections: Unknown (10/15/2021)   Received from [facility], [facility]   Social Network    Social Network: Not on file     Current Medications: Current Facility-Administered Medications  Medication Dose Route Frequency Provider Last Rate Last Admin   acetaminophen (TYLENOL) tablet 650 mg  650 mg Oral Q6H PRN Juliann Pares, NP   650 mg at 07/23/23 1402   albuterol (VENTOLIN HFA) 108 (90 Base) MCG/ACT inhaler 2 puff  2 puff Inhalation Q4H PRN Lewanda Rife, MD       alum & mag hydroxide-simeth (MAALOX/MYLANTA) 200-200-20 MG/5ML suspension 30 mL  30 mL Oral Q4H PRN Saucier, Jerlyn Ly, NP       ARIPiprazole (ABILIFY) tablet 5 mg  5 mg Oral Daily Derrian Rodak E, NP   5 mg at 07/23/23 0836   aspirin EC tablet 81 mg  81 mg Oral Daily Myriam Forehand, NP   81 mg at 07/23/23 1610   haloperidol lactate (HALDOL) injection 5 mg  5 mg Intramuscular TID PRN Myriam Forehand, NP   5 mg at 07/21/23 1047   And   diphenhydrAMINE (BENADRYL) injection 50 mg  50 mg Intramuscular TID PRN Myriam Forehand, NP   50 mg at 07/21/23 1047   And   LORazepam (ATIVAN) injection 2 mg  2 mg Intramuscular TID PRN Myriam Forehand, NP   2 mg at 07/21/23 1047   DULoxetine (CYMBALTA) DR capsule 60 mg  60 mg Oral QHS Myriam Forehand, NP   60 mg at 07/23/23 2101   feeding supplement (GLUCERNA SHAKE) (GLUCERNA SHAKE) liquid 237 mL  237 mL Oral BID BM Myriam Forehand, NP       fluticasone (FLONASE) 50  MCG/ACT nasal spray 2 spray  2 spray Each Nare Daily Myriam Forehand, NP   2 spray at 07/23/23 9604   gabapentin (NEURONTIN) capsule 100 mg  100 mg Oral Daily Myriam Forehand, NP   100 mg at 07/23/23 5409   gabapentin (NEURONTIN) capsule 400 mg  400 mg Oral QHS Myriam Forehand, NP   400 mg at 07/23/23 2059   hydrOXYzine (ATARAX) tablet 50 mg  50 mg Oral Q6H PRN Myriam Forehand, NP   50 mg at 07/22/23 2131   ipratropium (ATROVENT) 0.06 % nasal spray 2 spray  2 spray Each Nare QID Myriam Forehand, NP   2 spray at 07/23/23 2104   levothyroxine (SYNTHROID) tablet 50 mcg  50 mcg Oral Q0600 Myriam Forehand, NP   50 mcg at 07/23/23 0657   lisinopril (ZESTRIL) tablet 10 mg  10 mg Oral Daily Myriam Forehand, NP   10 mg  at 07/23/23 0836   magnesium hydroxide (MILK OF MAGNESIA) suspension 30 mL  30 mL Oral Daily PRN Saucier, Jerlyn Ly, NP       melatonin tablet 5 mg  5 mg Oral QHS Myriam Forehand, NP   5 mg at 07/23/23 2100   menthol-cetylpyridinium (CEPACOL) lozenge 3 mg  1 lozenge Oral PRN Myriam Forehand, NP   3 mg at 07/23/23 2102   metFORMIN (GLUCOPHAGE) tablet 1,000 mg  1,000 mg Oral BID Adonis Brook T, MD   1,000 mg at 07/23/23 1714   methocarbamol (ROBAXIN) tablet 500 mg  500 mg Oral BID Myriam Forehand, NP   500 mg at 07/23/23 1714   metoprolol succinate (TOPROL-XL) 24 hr tablet 25 mg  25 mg Oral Daily Myriam Forehand, NP   25 mg at 07/23/23 5284   multivitamin with minerals tablet 1 tablet  1 tablet Oral Daily Myriam Forehand, NP   1 tablet at 07/23/23 0836   nicotine (NICODERM CQ - dosed in mg/24 hours) patch 21 mg  21 mg Transdermal Daily Lewanda Rife, MD   21 mg at 07/23/23 0842   pantoprazole (PROTONIX) EC tablet 40 mg  40 mg Oral BID AC Mikey College T, MD   40 mg at 07/23/23 1714   phenol (CHLORASEPTIC) mouth spray 1 spray  1 spray Mouth/Throat PRN Myriam Forehand, NP       potassium chloride (KLOR-CON) packet 20 mEq  20 mEq Oral QHS Myriam Forehand, NP   20 mEq at 07/23/23 2101   sucralfate (CARAFATE) 1 GM/10ML  suspension 1 g  1 g Oral TID WC & HS Mikey College T, MD   1 g at 07/23/23 2104   thiamine (VITAMIN B1) tablet 100 mg  100 mg Oral Daily Myriam Forehand, NP   100 mg at 07/23/23 1324   traZODone (DESYREL) tablet 150 mg  150 mg Oral QHS Myriam Forehand, NP   150 mg at 07/23/23 2059    Lab Results:  Results for orders placed or performed during the hospital encounter of 07/17/23 (from the past 48 hours)  Glucose, capillary     Status: Abnormal   Collection Time: 07/22/23  8:05 AM  Result Value Ref Range   Glucose-Capillary 143 (H) 70 - 99 mg/dL    Comment: Glucose reference range applies only to samples taken after fasting for at least 8 hours.  Glucose, capillary     Status: Abnormal   Collection Time: 07/22/23 12:00 PM  Result Value Ref Range   Glucose-Capillary 117 (H) 70 - 99 mg/dL    Comment: Glucose reference range applies only to samples taken after fasting for at least 8 hours.  Glucose, capillary     Status: Abnormal   Collection Time: 07/22/23  4:09 PM  Result Value Ref Range   Glucose-Capillary 151 (H) 70 - 99 mg/dL    Comment: Glucose reference range applies only to samples taken after fasting for at least 8 hours.  Glucose, capillary     Status: Abnormal   Collection Time: 07/22/23  9:40 PM  Result Value Ref Range   Glucose-Capillary 124 (H) 70 - 99 mg/dL    Comment: Glucose reference range applies only to samples taken after fasting for at least 8 hours.   Comment 1 Document in Chart   Glucose, capillary     Status: Abnormal   Collection Time: 07/23/23  7:55 AM  Result Value Ref Range   Glucose-Capillary 134 (H)  70 - 99 mg/dL    Comment: Glucose reference range applies only to samples taken after fasting for at least 8 hours.   Comment 1 Notify RN   Glucose, capillary     Status: Abnormal   Collection Time: 07/23/23 11:56 AM  Result Value Ref Range   Glucose-Capillary 111 (H) 70 - 99 mg/dL    Comment: Glucose reference range applies only to samples taken after fasting for  at least 8 hours.  Glucose, capillary     Status: Abnormal   Collection Time: 07/23/23  4:11 PM  Result Value Ref Range   Glucose-Capillary 150 (H) 70 - 99 mg/dL    Comment: Glucose reference range applies only to samples taken after fasting for at least 8 hours.   Comment 1 Notify RN   Glucose, capillary     Status: Abnormal   Collection Time: 07/23/23  7:48 PM  Result Value Ref Range   Glucose-Capillary 108 (H) 70 - 99 mg/dL    Comment: Glucose reference range applies only to samples taken after fasting for at least 8 hours.    Blood Alcohol level:  Lab Results  Component Value Date   ETH 363 (HH) 07/16/2023   ETH 225 (H) 07/03/2023    Metabolic Disorder Labs: Lab Results  Component Value Date   HGBA1C 8.4 (H) 07/18/2023   MPG 194.38 07/18/2023   Lab Results  Component Value Date   CHOL 145 07/18/2023   TRIG 61 07/18/2023   HDL 76 07/18/2023   CHOLHDL 1.9 07/18/2023   VLDL 12 07/18/2023   LDLCALC 57 07/18/2023    Physical Findings: AIMS: Facial and Oral Movements Muscles of Facial Expression: Minimal, may be extreme normal Lips and Perioral Area: Minimal, may be extreme normal Jaw: Minimal, may be extreme normal Tongue: Minimal, may be extreme normal,Extremity Movements Upper (arms, wrists, hands, fingers): Minimal, may be extreme normal Lower (legs, knees, ankles, toes): None, Trunk Movements Neck, shoulders, hips: None, Global Judgements Severity of abnormal movements overall : Minimal, may be extreme normal Incapacitation due to abnormal movements: None Patient's awareness of abnormal movements: No Awareness, Dental Status Current problems with teeth and/or dentures?: No Does patient usually wear dentures?: No Edentia?: No  CIWA:  CIWA-Ar Total: 0 COWS:     Musculoskeletal: Strength & Muscle Tone: within normal limits Gait & Station: normal Patient leans: N/A  Psychiatric Specialty Exam:  Presentation  General Appearance:  Appropriate for  Environment; Neat (restless, appears physically uncomfortable.)  Eye Contact: Good  Speech: Clear and Coherent; Normal Rate  Speech Volume: Soft  Handedness: Right   Mood and Affect  Mood: "Anxious"  Affect: Flat, tearful   Thought Process  Thought Processes: Coherent; Linear  Descriptions of Associations:Intact  Orientation:Full (Time, Place and Person)  Thought Content:WDL  History of Schizophrenia/Schizoaffective disorder:No  Duration of Psychotic Symptoms: few months  Hallucinations:Denies  Ideas of Reference:None  Suicidal Thoughts:None  Homicidal Thoughts:None   Sensorium  Memory: Immediate Good; Recent Good; Remote Good  Judgment: Good  Insight: Good   Executive Functions  Concentration: Fair  Attention Span: Fair  Recall: Good  Fund of Knowledge: Good  Language: Good   Psychomotor Activity  Psychomotor Activity: Slowed   Assets  Assets: Communication Skills; Housing; Health and safety inspector; Resilience   Sleep  Sleep: Good   Physical Exam Vitals and nursing note reviewed.  HENT:     Head: Normocephalic.     Nose: Nose normal.  Pulmonary:     Effort: Pulmonary effort is normal.  Musculoskeletal:        General: Normal range of motion.     Cervical back: Normal range of motion.  Neurological:     General: No focal deficit present.     Mental Status: She is oriented to person, place, and time. Mental status is at baseline.  Psychiatric:        Attention and Perception: Attention and perception normal.        Mood and Affect: Mood normal. Affect is flat.        Speech: Speech normal.        Behavior: Behavior normal. Behavior is cooperative.        Thought Content: Thought content normal. Thought content is not paranoid. Thought content does not include homicidal or suicidal ideation.        Cognition and Memory: Cognition and memory normal.        Judgment: Judgment normal.    Review of Systems   Gastrointestinal:  Positive for heartburn.  Musculoskeletal:  Positive for back pain and myalgias.  Psychiatric/Behavioral:  Positive for depression and substance abuse. Negative for hallucinations and suicidal ideas. The patient is nervous/anxious.   All other systems reviewed and are negative.  Blood pressure 136/71, pulse 85, temperature 98.6 F (37 C), resp. rate 20, height 5\' 5"  (1.651 m), weight 94.8 kg, SpO2 98%. Body mass index is 34.78 kg/m.   Treatment Plan Summary:  1.    Safety and Monitoring:               -- Involuntary admission to inpatient psychiatric unit for safety, stabilization and treatment             -- Daily contact with patient to assess and evaluate symptoms and progress in treatment             -- Patient's case to be discussed in multi-disciplinary team meeting             -- Observation Level : q15 minute checks             -- Vital signs:  q12 hours             -- Precautions: suicide, fall risk   2. Psychiatric Diagnoses and Treatment:    -- Continue Abilify 5mg  PO daily for MDD adjunct and paranoia            -- Continue Cymbalta 60mg  PO daily for MDD [home med]  -- Continue melatonin 5mg  PO at bedtime for sleep  -- Continue trazodone 150mg  PO at bedtime for insomnia --  The risks/benefits/side-effects/alternatives to this medication were discussed in detail with the patient and time was given for questions. The patient consents to medication trial.               -- Metabolic profile and EKG monitoring obtained while on an atypical antipsychotic  (BMI: 34.78; Lipid Panel: WNL; HbgA1c: 8.4; QTc:477)             -- Encouraged patient to participate in unit milieu and in scheduled group therapies             -- Short Term Goals: Ability to identify changes in lifestyle to reduce recurrence of condition will improve, Ability to verbalize feelings will improve, Ability to disclose and discuss suicidal ideas, Ability to demonstrate self-control will  improve, Ability to identify and develop effective coping behaviors will improve, Ability to maintain clinical measurements within normal limits will improve, Compliance with prescribed medications will  improve, and Ability to identify triggers associated with substance abuse/mental health issues will improve             -- Long Term Goals: Improvement in symptoms so as ready for discharge      3. Medical Issues Being Addressed:   Continue home meds:    Albuterol inhaler 2 puffs Q4H PRN wheezing   ASA 81mg  PO daily    Flonase 2 spray each nare daily   Atrovent 0.06% nasal spray 2 spray each nare QID   Synthroid PO daily for thyroid    Robaxin 500mg  PO BID for chronic pain (fibromyalgia)   Potassium chloride 20 mEq PO at bedtime   Hospitalist recommendations 07/21/23:  Dysphagia -Tactically, dysphagia symptoms are at laryngal phase, as evidenced by recent EMG study laryngoscope showing inflamed vocal cord and laryngeal tissue.  However, the main etiology appears to be severe GERD and distal esophageal stenosis. The distal esophageal stenosis likely related to poorly controlled GERD but also malignancy needs to be ruled out.  Noted that patient already has a referral to see [facility] for elective endoscopy.  Patient has pain associated with swallowing solid food but likely from inflamed laryngal tissue from poorly controlled GERD which was confirmed with ENT in the recent laryngoscope done about 2 weeks ago.  And patient was referred to see [facility].  No odynophagia or weight loss as per patient. -For now we will increase her PPI to twice daily and start patient on Carafate 3 times daily -Stay upright at least 30 minutes after eating meals -Head of bed 30 degrees when sleeping if possible -Outpatient follow-up with [facility] ASAP -D/C Ibuprofen and other NSAIDS -Avoid alcohol containing beverage -Stop cigar smoking -No further workup needed during this hospitalization.   IIDM -Most recent  A1c 8.4 -Continue with metformin, increase to 1000 mg twice daily, repeat A1c in 3 months   HTN -Fairly controlled, continue lisinopril   Diabetic neuropathy -Stable, continue gabapentin   4. Discharge Planning:             -- Social work and case management to assist with discharge planning and identification of hospital follow-up needs prior to discharge             -- Estimated LOS: 5-7 days             -- Discharge Concerns: Need to establish a safety plan; Medication compliance and effectiveness             -- Discharge Goals: Long-term substance use treatment program    Shirley Decamp E Randel Hargens, NP 07/23/2023, 11:01 PM

## 2023-07-23 NOTE — Group Note (Signed)
Date:  07/23/2023 Time:  9:31 PM  Group Topic/Focus:  Wrap-Up Group:   The focus of this group is to help patients review their daily goal of treatment and discuss progress on daily workbooks.    Participation Level:  Did Not Attend  Participation Quality:   none  Affect:   none  Cognitive:   none  Insight: None  Engagement in Group:   none  Modes of Intervention:   none  Additional Comments:  none   Belva Crome 07/23/2023, 9:31 PM

## 2023-07-23 NOTE — Group Note (Signed)
LCSW Group Therapy Note   Group Date: 07/23/2023 Start Time: 1300 End Time: 1407   Type of Therapy and Topic:  Group Therapy: Challenging Core Beliefs  Participation Level:  Active  Description of Group:  Patients were educated about core beliefs and asked to identify one harmful core belief that they have. Patients were asked to explore from where those beliefs originate. Patients were asked to discuss how those beliefs make them feel and the resulting behaviors of those beliefs. They were then be asked if those beliefs are true and, if so, what evidence they have to support them. Lastly, group members were challenged to replace those negative core beliefs with helpful beliefs.   Therapeutic Goals:   1. Patient will identify harmful core beliefs and explore the origins of such beliefs. 2. Patient will identify feelings and behaviors that result from those core beliefs. 3. Patient will discuss whether such beliefs are true. 4.  Patient will replace harmful core beliefs with helpful ones.  Summary of Patient Progress:  Patient actively engaged in processing and exploring how core beliefs are formed and how they impact thoughts, feelings, and behaviors. Patient proved open to input from peers and feedback from CSW. Patient demonstrated proficient insight into the subject matter, was respectful and supportive of peers, and participated throughout the entire session.   Therapeutic Modalities: Cognitive Behavioral Therapy; Solution-Focused Therapy   Lowry Ram, Theresia Majors 07/23/2023  2:14 PM

## 2023-07-23 NOTE — Group Note (Signed)
Recreation Therapy Group Note   Group Topic:Goal Setting  Group Date: 07/23/2023 Start Time: 1000 End Time: 1100 Facilitators: Rosina Lowenstein, LRT, CTRS Location:  Craft Room  Group Description: Product/process development scientist. Patients were given many different magazines, a glue stick, markers, and a piece of cardstock paper. LRT and pts discussed the importance of having goals in life. LRT and pts discussed the difference between short-term and long-term goals, as well as what a SMART goal is. LRT encouraged pts to create a vision board, with images they picked and then cut out with safety scissors from the magazine, for themselves, that capture their short and long-term goals. LRT encouraged pts to show and explain their vision board to the group.   Goal Area(s) Addressed:  Patient will gain knowledge of short vs. long term goals.  Patient will identify goals for themselves. Patient will practice setting SMART goals. Patient will verbalize their goals to LRT and peers.   Affect/Mood: Appropriate   Participation Level: Active and Engaged   Participation Quality: Independent   Behavior: Appropriate, Calm, and Cooperative   Speech/Thought Process: Coherent   Insight: Good   Judgement: Good   Modes of Intervention: Art, Exploration, Guided Discussion, Open Conversation, and Support   Patient Response to Interventions:  Attentive, Engaged, Interested , and Receptive   Education Outcome:  Acknowledges education   Clinical Observations/Individualized Feedback: Angela Landry was active in their participation of session activities and group discussion. 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 Pt appropriately identified images to reflect these goals. Pt interacted well with LRT and peers duration of session.    Plan: Continue to engage patient in RT group sessions 2-3x/week.   Rosina Lowenstein, LRT, CTRS 07/23/2023 12:04 PM

## 2023-07-23 NOTE — Progress Notes (Signed)
   07/23/23 0600  15 Minute Checks  Location Bedroom  Visual Appearance Calm  Behavior Sleeping  Sleep (Behavioral Health Patients Only)  Calculate sleep? (Click Yes once per 24 hr at 0600 safety check) Yes  Documented sleep last 24 hours 8.5

## 2023-07-23 NOTE — Progress Notes (Signed)
   07/23/23 0836  Psych Admission Type (Psych Patients Only)  Admission Status Involuntary  Psychosocial Assessment  Patient Complaints Anxiety;Depression  Eye Contact Fair  Facial Expression Anxious  Affect Anxious  Speech Logical/coherent  Interaction Assertive  Motor Activity Slow  Appearance/Hygiene In scrubs  Behavior Characteristics Cooperative;Anxious  Mood Anxious  Thought Process  Coherency WDL  Content WDL  Delusions None reported or observed  Perception WDL  Hallucination None reported or observed  Judgment Impaired  Confusion None  Danger to Self  Current suicidal ideation? Denies  Agreement Not to Harm Self Yes  Description of Agreement Verbal  Danger to Others  Danger to Others None reported or observed

## 2023-07-23 NOTE — Group Note (Signed)
Recreation Therapy Group Note   Group Topic:Coping Skills  Group Date: 07/23/2023 Start Time: 1530 End Time: 1615 Facilitators: Rosina Lowenstein, LRT, CTRS Location:  Dayroom  Group Description: Coping A-Z. LRT and patients engage in a guided discussion on what coping skills are and gave specific examples. LRT passed out a handout labeled Coping A-Z with blank spaces beside each letter. LRT prompted patients to come up with a coping skill for each of the letters. LRT and patients went over the handout and gave ideas for each letter if anyone had any blanks left on their paper. Patients kept this handout with them that listed 26 different coping skills.   Goal Area(s) Addressed: Patients will be able to define "coping skills". Patient will identify new coping skills.  Patient will increase communication.   Affect/Mood: Appropriate   Participation Level: Active and Engaged   Participation Quality: Independent   Behavior: Appropriate, Calm, and Cooperative   Speech/Thought Process: Coherent   Insight: Good   Judgement: Good   Modes of Intervention: Clarification, Education, Exploration, Guided Discussion, Open Conversation, and Worksheet   Patient Response to Interventions:  Attentive, Engaged, Interested , and Receptive   Education Outcome:  Acknowledges education   Clinical Observations/Individualized Feedback: Angela Landry was active in their participation of session activities and group discussion. Pt identified "cleaning, self care, dancing, reading" as coping skills. Pt spontaneously contributed to group discussion. Pt was able to identify all coping skills independently, therefore received a stress ball as a prize. Pt interacted well with LRT and peers duration of session.     Plan: Continue to engage patient in RT group sessions 2-3x/week.   Rosina Lowenstein, LRT, CTRS 07/23/2023 5:40 PM

## 2023-07-23 NOTE — Plan of Care (Signed)
Problem: Education: Goal: Emotional status will improve Outcome: Progressing Goal: Mental status will improve Outcome: Progressing Goal: Verbalization of understanding the information provided will improve Outcome: Progressing

## 2023-07-23 NOTE — BHH Counselor (Signed)
CSW touched base with patient who called [facility] at [phone] to confirm her GI appointment for 07/25/23 at 10 AM.   Patient was encouraged to reach out to her parents as they were previously reported as a good support system for the patient. When asked, patient made a contorted face and explained that she would rather do this independently as she "can handle it."  Patient reported that she has touched base with [facility] who explained that once she received her GI results and sends it in, they can likely arrange transportation for same day admission.   Patient reports that she has the ability to scan and send her results from her chart independently.   CSW communicated this with the team to prepare for safe discharged.   Reymundo Poll, MSW, LCSWA 07/23/2023 11:12 AM

## 2023-07-23 NOTE — Progress Notes (Signed)
   07/22/23 2125  Psych Admission Type (Psych Patients Only)  Admission Status Involuntary  Psychosocial Assessment  Patient Complaints Anxiety;Depression  Eye Contact Fair  Facial Expression Anxious  Affect Anxious;Preoccupied  Speech Logical/coherent  Interaction Assertive  Motor Activity Slow  Appearance/Hygiene In scrubs  Behavior Characteristics Cooperative;Anxious  Mood Anxious;Preoccupied  Aggressive Behavior  Effect No apparent injury  Thought Process  Coherency WDL  Content WDL  Delusions None reported or observed  Perception WDL  Hallucination None reported or observed  Judgment Impaired  Confusion None  Danger to Self  Current suicidal ideation? Denies  Agreement Not to Harm Self Yes  Description of Agreement Verbal  Danger to Others  Danger to Others None reported or observed

## 2023-07-24 ENCOUNTER — Other Ambulatory Visit: Payer: Self-pay

## 2023-07-24 DIAGNOSIS — F323 Major depressive disorder, single episode, severe with psychotic features: Secondary | ICD-10-CM | POA: Diagnosis not present

## 2023-07-24 LAB — GLUCOSE, CAPILLARY
Glucose-Capillary: 129 mg/dL — ABNORMAL HIGH (ref 70–99)
Glucose-Capillary: 94 mg/dL (ref 70–99)

## 2023-07-24 MED ORDER — ARIPIPRAZOLE 5 MG PO TABS
5.0000 mg | ORAL_TABLET | Freq: Every day | ORAL | 0 refills | Status: DC
  Filled 2023-07-24: qty 30, 30d supply, fill #0

## 2023-07-24 MED ORDER — ALBUTEROL SULFATE HFA 108 (90 BASE) MCG/ACT IN AERS
2.0000 | INHALATION_SPRAY | RESPIRATORY_TRACT | 0 refills | Status: AC | PRN
Start: 2023-07-24 — End: ?

## 2023-07-24 MED ORDER — ARIPIPRAZOLE 5 MG PO TABS: 5.0000 mg | ORAL_TABLET | Freq: Every day | ORAL | 0 refills | Status: AC

## 2023-07-24 MED ORDER — METHOCARBAMOL 500 MG PO TABS: 500.0000 mg | ORAL_TABLET | Freq: Two times a day (BID) | ORAL | 0 refills | Status: AC

## 2023-07-24 MED ORDER — METHOCARBAMOL 500 MG PO TABS
500.0000 mg | ORAL_TABLET | Freq: Two times a day (BID) | ORAL | 0 refills | Status: DC
  Filled 2023-07-24: qty 60, 30d supply, fill #0

## 2023-07-24 MED ORDER — GABAPENTIN 400 MG PO CAPS: 400.0000 mg | ORAL_CAPSULE | Freq: Every day | ORAL | 0 refills | Status: DC

## 2023-07-24 MED ORDER — POTASSIUM CHLORIDE ER 10 MEQ PO TBCR
20.0000 meq | EXTENDED_RELEASE_TABLET | Freq: Every day | ORAL | 0 refills | Status: DC
Start: 2023-07-24 — End: 2023-07-24
  Filled 2023-07-24: qty 60, 30d supply, fill #0

## 2023-07-24 MED ORDER — LISINOPRIL 10 MG PO TABS: 10.0000 mg | ORAL_TABLET | Freq: Every day | ORAL | 0 refills | Status: AC

## 2023-07-24 MED ORDER — METFORMIN HCL 1000 MG PO TABS: 1000.0000 mg | ORAL_TABLET | Freq: Two times a day (BID) | ORAL | 0 refills | Status: DC

## 2023-07-24 MED ORDER — TRAZODONE HCL 100 MG PO TABS
150.0000 mg | ORAL_TABLET | Freq: Every day | ORAL | 0 refills | Status: DC
  Filled 2023-07-24: qty 45, 30d supply, fill #0

## 2023-07-24 MED ORDER — SUCRALFATE 1 G PO TABS: 1.0000 g | ORAL_TABLET | Freq: Three times a day (TID) | ORAL | 0 refills | Status: DC

## 2023-07-24 MED ORDER — ALBUTEROL SULFATE HFA 108 (90 BASE) MCG/ACT IN AERS: 2.0000 | INHALATION_SPRAY | RESPIRATORY_TRACT | 0 refills | Status: DC | PRN

## 2023-07-24 MED ORDER — METOPROLOL SUCCINATE ER 25 MG PO TB24
25.0000 mg | ORAL_TABLET | Freq: Every day | ORAL | 0 refills | Status: DC
  Filled 2023-07-24: qty 30, 30d supply, fill #0

## 2023-07-24 MED ORDER — POTASSIUM CHLORIDE ER 10 MEQ PO TBCR: 20.0000 meq | EXTENDED_RELEASE_TABLET | Freq: Every day | ORAL | 0 refills | Status: DC

## 2023-07-24 MED ORDER — VITAMIN B-1 100 MG PO TABS
100.0000 mg | ORAL_TABLET | Freq: Every day | ORAL | 0 refills | Status: DC
  Filled 2023-07-24: qty 30, 30d supply, fill #0

## 2023-07-24 MED ORDER — ARIPIPRAZOLE 5 MG PO TABS: 5.0000 mg | ORAL_TABLET | Freq: Every day | ORAL | 0 refills | Status: DC

## 2023-07-24 MED ORDER — SUCRALFATE 1 G PO TABS
1.0000 g | ORAL_TABLET | Freq: Three times a day (TID) | ORAL | 0 refills | Status: DC
  Filled 2023-07-24: qty 42, 11d supply, fill #0

## 2023-07-24 MED ORDER — POTASSIUM CHLORIDE ER 10 MEQ PO TBCR: 20.0000 meq | EXTENDED_RELEASE_TABLET | Freq: Every day | ORAL | 0 refills | Status: AC

## 2023-07-24 MED ORDER — DULOXETINE HCL 60 MG PO CPEP: 60.0000 mg | ORAL_CAPSULE | Freq: Every day | ORAL | 0 refills | Status: AC

## 2023-07-24 MED ORDER — MELATONIN 5 MG PO TABS
5.0000 mg | ORAL_TABLET | Freq: Every day | ORAL | 0 refills | Status: DC
Start: 2023-07-24 — End: 2023-07-24

## 2023-07-24 MED ORDER — METHOCARBAMOL 500 MG PO TABS: 500.0000 mg | ORAL_TABLET | Freq: Two times a day (BID) | ORAL | 0 refills | Status: DC

## 2023-07-24 MED ORDER — IPRATROPIUM BROMIDE 0.06 % NA SOLN
2.0000 | Freq: Four times a day (QID) | NASAL | 0 refills | Status: DC
  Filled 2023-07-24: qty 15, 19d supply, fill #0

## 2023-07-24 MED ORDER — GABAPENTIN 400 MG PO CAPS
400.0000 mg | ORAL_CAPSULE | Freq: Every day | ORAL | 0 refills | Status: DC
  Filled 2023-07-24: qty 30, 30d supply, fill #0

## 2023-07-24 MED ORDER — METFORMIN HCL 1000 MG PO TABS
1000.0000 mg | ORAL_TABLET | Freq: Two times a day (BID) | ORAL | 0 refills | Status: DC
  Filled 2023-07-24: qty 60, 30d supply, fill #0

## 2023-07-24 MED ORDER — IPRATROPIUM BROMIDE 0.06 % NA SOLN: 2.0000 | Freq: Four times a day (QID) | NASAL | 0 refills | Status: AC

## 2023-07-24 MED ORDER — PHENOL 1.4 % MT LIQD
1.0000 | OROMUCOSAL | 0 refills | Status: DC | PRN
  Filled 2023-07-24: qty 177, 30d supply, fill #0

## 2023-07-24 MED ORDER — GABAPENTIN 400 MG PO CAPS: 400.0000 mg | ORAL_CAPSULE | Freq: Every day | ORAL | 0 refills | Status: AC

## 2023-07-24 MED ORDER — SUCRALFATE 1 G PO TABS: 1.0000 g | ORAL_TABLET | Freq: Three times a day (TID) | ORAL | 0 refills | Status: AC

## 2023-07-24 MED ORDER — MELATONIN 5 MG PO TABS
5.0000 mg | ORAL_TABLET | Freq: Every day | ORAL | 0 refills | Status: AC
Start: 2023-07-24 — End: ?

## 2023-07-24 MED ORDER — ASPIRIN 81 MG PO TBEC
81.0000 mg | DELAYED_RELEASE_TABLET | Freq: Every day | ORAL | 0 refills | Status: DC
  Filled 2023-07-24: qty 30, 30d supply, fill #0

## 2023-07-24 MED ORDER — ADULT MULTIVITAMIN W/MINERALS CH: 1.0000 | ORAL_TABLET | Freq: Every day | ORAL | 0 refills | Status: DC

## 2023-07-24 MED ORDER — DULOXETINE HCL 60 MG PO CPEP
60.0000 mg | ORAL_CAPSULE | Freq: Every day | ORAL | 0 refills | Status: DC
  Filled 2023-07-24: qty 30, 30d supply, fill #0

## 2023-07-24 MED ORDER — TRAZODONE HCL 100 MG PO TABS: 150.0000 mg | ORAL_TABLET | Freq: Every day | ORAL | 0 refills | Status: DC

## 2023-07-24 MED ORDER — TRAZODONE HCL 100 MG PO TABS: 150.0000 mg | ORAL_TABLET | Freq: Every day | ORAL | 0 refills | Status: AC

## 2023-07-24 MED ORDER — FLUTICASONE PROPIONATE 50 MCG/ACT NA SUSP
2.0000 | Freq: Every day | NASAL | 0 refills | Status: DC
  Filled 2023-07-24: qty 16, 30d supply, fill #0

## 2023-07-24 MED ORDER — PANTOPRAZOLE SODIUM 40 MG PO TBEC: 40.0000 mg | DELAYED_RELEASE_TABLET | Freq: Two times a day (BID) | ORAL | 0 refills | Status: AC

## 2023-07-24 MED ORDER — METOPROLOL SUCCINATE ER 25 MG PO TB24: 25.0000 mg | ORAL_TABLET | Freq: Every day | ORAL | 0 refills | Status: DC

## 2023-07-24 MED ORDER — VITAMIN B-1 100 MG PO TABS: 100.0000 mg | ORAL_TABLET | Freq: Every day | ORAL | 0 refills | Status: DC

## 2023-07-24 MED ORDER — GABAPENTIN 100 MG PO CAPS: 100.0000 mg | ORAL_CAPSULE | Freq: Every day | ORAL | 0 refills | Status: DC

## 2023-07-24 MED ORDER — LEVOTHYROXINE SODIUM 50 MCG PO TABS
50.0000 ug | ORAL_TABLET | Freq: Every day | ORAL | 0 refills | Status: DC
  Filled 2023-07-24: qty 30, 30d supply, fill #0

## 2023-07-24 MED ORDER — LEVOTHYROXINE SODIUM 50 MCG PO TABS: 50.0000 ug | ORAL_TABLET | Freq: Every day | ORAL | 0 refills | Status: DC

## 2023-07-24 MED ORDER — VITAMIN B-1 100 MG PO TABS: 100.0000 mg | ORAL_TABLET | Freq: Every day | ORAL | 0 refills | Status: AC

## 2023-07-24 MED ORDER — GABAPENTIN 100 MG PO CAPS: 100.0000 mg | ORAL_CAPSULE | Freq: Every day | ORAL | 0 refills | Status: AC

## 2023-07-24 MED ORDER — ADULT MULTIVITAMIN W/MINERALS CH
1.0000 | ORAL_TABLET | Freq: Every day | ORAL | 0 refills | Status: AC
Start: 2023-07-25 — End: ?

## 2023-07-24 MED ORDER — GABAPENTIN 100 MG PO CAPS
100.0000 mg | ORAL_CAPSULE | Freq: Every day | ORAL | 0 refills | Status: DC
Start: 2023-07-25 — End: 2023-07-24
  Filled 2023-07-24: qty 30, 30d supply, fill #0

## 2023-07-24 MED ORDER — PANTOPRAZOLE SODIUM 40 MG PO TBEC
40.0000 mg | DELAYED_RELEASE_TABLET | Freq: Two times a day (BID) | ORAL | 0 refills | Status: DC
  Filled 2023-07-24: qty 60, 30d supply, fill #0

## 2023-07-24 MED ORDER — DULOXETINE HCL 60 MG PO CPEP: 60.0000 mg | ORAL_CAPSULE | Freq: Every day | ORAL | 0 refills | Status: DC

## 2023-07-24 MED ORDER — ASPIRIN 81 MG PO TBEC: 81.0000 mg | DELAYED_RELEASE_TABLET | Freq: Every day | ORAL | 0 refills | Status: DC

## 2023-07-24 MED ORDER — METFORMIN HCL 1000 MG PO TABS: 1000.0000 mg | ORAL_TABLET | Freq: Two times a day (BID) | ORAL | 0 refills | Status: AC

## 2023-07-24 MED ORDER — ADULT MULTIVITAMIN W/MINERALS CH
1.0000 | ORAL_TABLET | Freq: Every day | ORAL | 0 refills | Status: DC
  Filled 2023-07-24: qty 30, 30d supply, fill #0

## 2023-07-24 MED ORDER — METOPROLOL SUCCINATE ER 25 MG PO TB24: 25.0000 mg | ORAL_TABLET | Freq: Every day | ORAL | 0 refills | Status: AC

## 2023-07-24 MED ORDER — PHENOL 1.4 % MT LIQD: 1.0000 | OROMUCOSAL | 0 refills | Status: AC | PRN

## 2023-07-24 MED ORDER — ASPIRIN 81 MG PO TBEC: 81.0000 mg | DELAYED_RELEASE_TABLET | Freq: Every day | ORAL | 0 refills | Status: AC

## 2023-07-24 MED ORDER — PANTOPRAZOLE SODIUM 40 MG PO TBEC: 40.0000 mg | DELAYED_RELEASE_TABLET | Freq: Two times a day (BID) | ORAL | 0 refills | Status: DC

## 2023-07-24 MED ORDER — MELATONIN 5 MG PO TABS
5.0000 mg | ORAL_TABLET | Freq: Every day | ORAL | 0 refills | Status: DC
  Filled 2023-07-24: qty 30, 30d supply, fill #0

## 2023-07-24 MED ORDER — FLUTICASONE PROPIONATE 50 MCG/ACT NA SUSP: 2.0000 | Freq: Every day | NASAL | 0 refills | Status: AC

## 2023-07-24 MED ORDER — LEVOTHYROXINE SODIUM 50 MCG PO TABS: 50.0000 ug | ORAL_TABLET | Freq: Every day | ORAL | 0 refills | Status: AC

## 2023-07-24 NOTE — Progress Notes (Signed)
Patient ID: Angela Landry, female   DOB: December 18, 1980, 43 y.o.   MRN: [id number]  Patient was discharged from Behavioral Medicine Unit at approx 1615 escorted by staff. Patient denies SI/HI/AVH. Discharge packet to include AVS, printed prescriptions, Suicide Risk Assessment, and Transition Record reviewed with patient. Belongings returned. Suicide safety plan unable to be completed.

## 2023-07-24 NOTE — BH IP Treatment Plan (Signed)
 Interdisciplinary Treatment and Diagnostic Plan Update  07/24/2023 Time of Session: 8:30AM Lenay Lovejoy MRN: [id number]  Principal Diagnosis: Severe major depression, single episode, with psychotic features (HCC)  Secondary Diagnoses: Principal Problem:   Severe major depression, single episode, with psychotic features (HCC) Active Problems:   Suicidal ideation   Alcohol use disorder, severe, dependence (HCC)   Esophageal dysphagia   Current Medications:  Current Facility-Administered Medications  Medication Dose Route Frequency Provider Last Rate Last Admin   acetaminophen (TYLENOL) tablet 650 mg  650 mg Oral Q6H PRN Saucier, Jerlyn Ly, NP   650 mg at 07/24/23 0912   albuterol (VENTOLIN HFA) 108 (90 Base) MCG/ACT inhaler 2 puff  2 puff Inhalation Q4H PRN Lewanda Rife, MD       alum & mag hydroxide-simeth (MAALOX/MYLANTA) 200-200-20 MG/5ML suspension 30 mL  30 mL Oral Q4H PRN Saucier, Jerlyn Ly, NP       ARIPiprazole (ABILIFY) tablet 5 mg  5 mg Oral Daily Remington, Amber E, NP   5 mg at 07/24/23 0915   aspirin EC tablet 81 mg  81 mg Oral Daily Myriam Forehand, NP   81 mg at 07/24/23 0915   haloperidol lactate (HALDOL) injection 5 mg  5 mg Intramuscular TID PRN Myriam Forehand, NP   5 mg at 07/21/23 1047   And   diphenhydrAMINE (BENADRYL) injection 50 mg  50 mg Intramuscular TID PRN Myriam Forehand, NP   50 mg at 07/21/23 1047   And   LORazepam (ATIVAN) injection 2 mg  2 mg Intramuscular TID PRN Myriam Forehand, NP   2 mg at 07/21/23 1047   DULoxetine (CYMBALTA) DR capsule 60 mg  60 mg Oral QHS Myriam Forehand, NP   60 mg at 07/23/23 2101   feeding supplement (GLUCERNA SHAKE) (GLUCERNA SHAKE) liquid 237 mL  237 mL Oral BID BM Myriam Forehand, NP   237 mL at 07/24/23 0929   fluticasone (FLONASE) 50 MCG/ACT nasal spray 2 spray  2 spray Each Nare Daily Myriam Forehand, NP   2 spray at 07/24/23 0914   gabapentin (NEURONTIN) capsule 100 mg  100 mg Oral Daily Myriam Forehand, NP   100 mg at  07/24/23 0915   gabapentin (NEURONTIN) capsule 400 mg  400 mg Oral QHS Myriam Forehand, NP   400 mg at 07/23/23 2059   hydrOXYzine (ATARAX) tablet 50 mg  50 mg Oral Q6H PRN Myriam Forehand, NP   50 mg at 07/22/23 2131   ipratropium (ATROVENT) 0.06 % nasal spray 2 spray  2 spray Each Nare QID Myriam Forehand, NP   2 spray at 07/24/23 0916   levothyroxine (SYNTHROID) tablet 50 mcg  50 mcg Oral Q0600 Myriam Forehand, NP   50 mcg at 07/24/23 4098   lisinopril (ZESTRIL) tablet 10 mg  10 mg Oral Daily Myriam Forehand, NP   10 mg at 07/24/23 0915   magnesium hydroxide (MILK OF MAGNESIA) suspension 30 mL  30 mL Oral Daily PRN Saucier, Jerlyn Ly, NP       melatonin tablet 5 mg  5 mg Oral QHS Myriam Forehand, NP   5 mg at 07/23/23 2100   menthol-cetylpyridinium (CEPACOL) lozenge 3 mg  1 lozenge Oral PRN Myriam Forehand, NP   3 mg at 07/23/23 2102   metFORMIN (GLUCOPHAGE) tablet 1,000 mg  1,000 mg Oral BID AC Mikey College T, MD   1,000 mg at 07/24/23 0916   methocarbamol (ROBAXIN)  tablet 500 mg  500 mg Oral BID Myriam Forehand, NP   500 mg at 07/24/23 0915   metoprolol succinate (TOPROL-XL) 24 hr tablet 25 mg  25 mg Oral Daily Myriam Forehand, NP   25 mg at 07/24/23 0915   multivitamin with minerals tablet 1 tablet  1 tablet Oral Daily Myriam Forehand, NP   1 tablet at 07/24/23 2706   nicotine (NICODERM CQ - dosed in mg/24 hours) patch 21 mg  21 mg Transdermal Daily Lewanda Rife, MD   21 mg at 07/24/23 0915   pantoprazole (PROTONIX) EC tablet 40 mg  40 mg Oral BID Adonis Brook T, MD   40 mg at 07/24/23 0915   phenol (CHLORASEPTIC) mouth spray 1 spray  1 spray Mouth/Throat PRN Myriam Forehand, NP       potassium chloride (KLOR-CON) packet 20 mEq  20 mEq Oral QHS Myriam Forehand, NP   20 mEq at 07/23/23 2101   sucralfate (CARAFATE) 1 GM/10ML suspension 1 g  1 g Oral TID WC & HS Mikey College T, MD   1 g at 07/24/23 2376   thiamine (VITAMIN B1) tablet 100 mg  100 mg Oral Daily Myriam Forehand, NP   100 mg at 07/24/23 2831    traZODone (DESYREL) tablet 150 mg  150 mg Oral QHS Myriam Forehand, NP   150 mg at 07/23/23 2059   PTA Medications: Medications Prior to Admission  Medication Sig Dispense Refill Last Dose/Taking   ACCU-CHEK GUIDE test strip USE 1-4 TIMES DAILY AS DIRECTED BY PHYSICIAN TO MONITOR BLOOD SUGARS      albuterol (VENTOLIN HFA) 108 (90 Base) MCG/ACT inhaler Inhale 1-2 puffs into the lungs every 6 (six) hours as needed for wheezing or shortness of breath. 1 g 0    ARMOUR THYROID 120 MG tablet Take 120 mg by mouth every morning. (Patient not taking: Reported on 07/16/2023)      ASPIRIN LOW DOSE 81 MG EC tablet Take 81 mg by mouth daily.      azithromycin (ZITHROMAX Z-PAK) 250 MG tablet Take 2 tablets on day 1 then 1 tablet daily (Patient not taking: Reported on 07/16/2023) 6 tablet 0    cetirizine (ZYRTEC) 10 MG tablet Take 10 mg by mouth daily.      DULoxetine (CYMBALTA) 30 MG capsule Take 60 mg by mouth daily.      DULoxetine (CYMBALTA) 60 MG capsule Take 60 mg by mouth daily.      fluticasone (FLONASE) 50 MCG/ACT nasal spray Place 2 sprays into both nostrils daily. 1 g 0    folic acid (FOLVITE) 1 MG tablet Take 1 tablet (1 mg total) by mouth daily. 90 tablet 3    gabapentin (NEURONTIN) 300 MG capsule Take 3 capsules by mouth daily.      ipratropium (ATROVENT) 0.06 % nasal spray Place 2 sprays into both nostrils 4 (four) times daily. 15 mL 0    levonorgestrel (MIRENA) 20 MCG/DAY IUD by Intrauterine route.      levothyroxine (SYNTHROID) 50 MCG tablet Take 50 mcg by mouth daily.      linaclotide (LINZESS) 290 MCG CAPS capsule  (Patient not taking: Reported on 07/16/2023)      lisinopril (ZESTRIL) 10 MG tablet Take 1 tablet by mouth daily.      meloxicam (MOBIC) 15 MG tablet Take 15 mg by mouth daily.      metFORMIN (GLUCOPHAGE) 500 MG tablet Take 1 tablet (500 mg total) by mouth  2 (two) times daily with a meal. 60 tablet 1    metoprolol succinate (TOPROL-XL) 25 MG 24 hr tablet Take 25 mg by mouth daily.       naproxen sodium (ANAPROX DS) 550 MG tablet Take 1 tablet (550 mg total) by mouth 2 (two) times daily with a meal. 30 tablet 0    norethindrone (MICRONOR) 0.35 MG tablet Take 1 tablet by mouth daily.      omeprazole (PRILOSEC) 20 MG capsule Take 1 capsule by mouth daily.      ondansetron (ZOFRAN-ODT) 4 MG disintegrating tablet Take 1 tablet (4 mg total) by mouth every 6 (six) hours as needed for nausea or vomiting. 20 tablet 0    OZEMPIC, 0.25 OR 0.5 MG/DOSE, 2 MG/1.5ML SOPN Inject 0.5 mg into the skin once a week. (Patient not taking: Reported on 07/16/2023)      pantoprazole (PROTONIX) 40 MG tablet Take 1 tablet (40 mg total) by mouth daily. 30 tablet 1    phenol (CHLORASEPTIC GARGLE) 1.4 % LIQD Use as directed 1 spray in the mouth or throat as needed for throat irritation / pain. 20 mL 0    potassium chloride (KLOR-CON) 10 MEQ tablet Take 1 tablet by mouth daily.      predniSONE (STERAPRED UNI-PAK 21 TAB) 10 MG (21) TBPK tablet Take by mouth daily. Take 6 tabs by mouth daily for 1, then 5 tabs for 1 day, then 4 tabs for 1 day, then 3 tabs for 1 day, then 2 tabs for 1 day, then 1 tab for 1 day. (Patient not taking: Reported on 07/16/2023) 21 tablet 0    promethazine-dextromethorphan (PROMETHAZINE-DM) 6.25-15 MG/5ML syrup Take 5 mLs by mouth 4 (four) times daily as needed. 118 mL 0    Spacer/Aero-Holding Chambers (AEROCHAMBER MV) inhaler Use as instructed 1 each 1    thiamine (VITAMIN B1) 100 MG tablet Take 1 tablet (100 mg total) by mouth daily. 90 tablet 3    tranexamic acid (LYSTEDA) 650 MG TABS tablet Take by mouth. (Patient not taking: Reported on 07/16/2023)      traZODone (DESYREL) 100 MG tablet Take 100 mg by mouth at bedtime.      traZODone (DESYREL) 50 MG tablet TAKE 1/2 TO 1 TABLET BY MOUTH EVERY NIGHT 1 HOUR BEFORE BEDTIME (Patient not taking: Reported on 07/16/2023)       Patient Stressors: Financial difficulties   Legal issue   Marital or family conflict   Medication change or noncompliance    Substance abuse    Patient Strengths: Ability for insight  Forensic psychologist fund of knowledge  Motivation for treatment/growth  Supportive family/friends   Treatment Modalities: Medication Management, Group therapy, Case management,  1 to 1 session with clinician, Psychoeducation, Recreational therapy.   Physician Treatment Plan for Primary Diagnosis: Severe major depression, single episode, with psychotic features (HCC) Long Term Goal(s): Improvement in symptoms so as ready for discharge   Short Term Goals: Ability to identify changes in lifestyle to reduce recurrence of condition will improve Ability to verbalize feelings will improve Ability to disclose and discuss suicidal ideas Ability to demonstrate self-control will improve Ability to identify and develop effective coping behaviors will improve Ability to maintain clinical measurements within normal limits will improve Compliance with prescribed medications will improve Ability to identify triggers associated with substance abuse/mental health issues will improve  Medication Management: Evaluate patient's response, side effects, and tolerance of medication regimen.  Therapeutic Interventions: 1 to 1 sessions, Unit Group sessions  and Medication administration.  Evaluation of Outcomes: Adequate for Discharge  Physician Treatment Plan for Secondary Diagnosis: Principal Problem:   Severe major depression, single episode, with psychotic features (HCC) Active Problems:   Suicidal ideation   Alcohol use disorder, severe, dependence (HCC)   Esophageal dysphagia  Long Term Goal(s): Improvement in symptoms so as ready for discharge   Short Term Goals: Ability to identify changes in lifestyle to reduce recurrence of condition will improve Ability to verbalize feelings will improve Ability to disclose and discuss suicidal ideas Ability to demonstrate self-control will improve Ability to identify and develop  effective coping behaviors will improve Ability to maintain clinical measurements within normal limits will improve Compliance with prescribed medications will improve Ability to identify triggers associated with substance abuse/mental health issues will improve     Medication Management: Evaluate patient's response, side effects, and tolerance of medication regimen.  Therapeutic Interventions: 1 to 1 sessions, Unit Group sessions and Medication administration.  Evaluation of Outcomes: Adequate for Discharge   RN Treatment Plan for Primary Diagnosis: Severe major depression, single episode, with psychotic features (HCC) Long Term Goal(s): Knowledge of disease and therapeutic regimen to maintain health will improve  Short Term Goals: Ability to demonstrate self-control, Ability to participate in decision making will improve, Ability to verbalize feelings will improve, Ability to disclose and discuss suicidal ideas, Ability to identify and develop effective coping behaviors will improve, and Compliance with prescribed medications will improve  Medication Management: RN will administer medications as ordered by provider, will assess and evaluate patient's response and provide education to patient for prescribed medication. RN will report any adverse and/or side effects to prescribing provider.  Therapeutic Interventions: 1 on 1 counseling sessions, Psychoeducation, Medication administration, Evaluate responses to treatment, Monitor vital signs and CBGs as ordered, Perform/monitor CIWA, COWS, AIMS and Fall Risk screenings as ordered, Perform wound care treatments as ordered.  Evaluation of Outcomes: Adequate for Discharge   LCSW Treatment Plan for Primary Diagnosis: Severe major depression, single episode, with psychotic features (HCC) Long Term Goal(s): Safe transition to appropriate next level of care at discharge, Engage patient in therapeutic group addressing interpersonal concerns.  Short  Term Goals: Engage patient in aftercare planning with referrals and resources, Increase social support, Increase ability to appropriately verbalize feelings, Increase emotional regulation, Facilitate acceptance of mental health diagnosis and concerns, Facilitate patient progression through stages of change regarding substance use diagnoses and concerns, and Identify triggers associated with mental health/substance abuse issues  Therapeutic Interventions: Assess for all discharge needs, 1 to 1 time with Social worker, Explore available resources and support systems, Assess for adequacy in community support network, Educate family and significant other(s) on suicide prevention, Complete Psychosocial Assessment, Interpersonal group therapy.  Evaluation of Outcomes: Adequate for Discharge   Progress in Treatment: Attending groups: Yes. Participating in groups: Yes. Taking medication as prescribed: Yes. Toleration medication: Yes. Family/Significant other contact made: No, will contact:  once permission has been granted Patient understands diagnosis: Yes. Discussing patient identified problems/goals with staff: Yes. Medical problems stabilized or resolved: Yes. Denies suicidal/homicidal ideation: Yes. Issues/concerns per patient self-inventory: No. Other: none  New problem(s) identified: No, Describe:  none identified.  Update 07/24/2023: No changes at this time.    New Short Term/Long Term Goal(s): detox, medication management for mood stabilization; elimination of SI thoughts; development of comprehensive mental wellness/sobriety plan. Update 07/24/2023: No changes at this time.    Patient Goals:  "Trying to get my mind right, get to feeling better." Update 07/24/2023:  No changes at this time.    Discharge Plan or Barriers: CSW will assist pt with development of an appropriate aftercare/discharge plan. Update 07/24/2023: Patient remains safe on the unit.  Patient to be discharged on 07/24/2023.   Patient to discharge to home and then go to a GI appointment.  Once the GI appointment has been completed and resulted the patient can begin her residential treatment services.    Reason for Continuation of Hospitalization: Anxiety Depression Medication stabilization Suicidal ideation Withdrawal symptoms   Estimated Length of Stay: 1-7 days  Update 07/24/2023: TBD    Last 3 [place] Suicide Severity Risk Score: Flowsheet Row Admission (Current) from 07/17/2023 in [facility] INPATIENT [facility]AL MEDICINE ED from 07/16/2023 in [facility] Emergency Department at [facility] ED from 07/03/2023 in [facility] Emergency Department at [facility]  C-SSRS RISK CATEGORY Error: Q3, 4, or 5 should not be populated when Q2 is No Low Risk No Risk       Last PHQ 2/9 Scores:     No data to display          Scribe for Treatment Team: Harden Mo, LCSW 07/24/2023 10:46 AM
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
## 2023-07-24 NOTE — Plan of Care (Signed)
  Problem: Education: Goal: Knowledge of [facility] Education information/materials will improve Outcome: Progressing Goal: Emotional status will improve Outcome: Progressing Goal: Mental status will improve Outcome: Progressing Goal: Verbalization of understanding the information provided will improve Outcome: Progressing   Problem: Activity: Goal: Interest or engagement in activities will improve Outcome: Progressing Goal: Sleeping patterns will improve Outcome: Progressing   Problem: Coping: Goal: Ability to verbalize frustrations and anger appropriately will improve Outcome: Progressing Goal: Ability to demonstrate self-control will improve Outcome: Progressing   Problem: Health Behavior/Discharge Planning: Goal: Identification of resources available to assist in meeting health care needs will improve Outcome: Progressing Goal: Compliance with treatment plan for underlying cause of condition will improve Outcome: Progressing   Problem: Physical Regulation: Goal: Ability to maintain clinical measurements within normal limits will improve Outcome: Progressing   Problem: Safety: Goal: Periods of time without injury will increase Outcome: Progressing   Problem: Coping: Goal: Ability to identify and develop effective coping behavior will improve Outcome: Progressing   Problem: Education: Goal: Knowledge of disease or condition will improve Outcome: Progressing   Problem: Physical Regulation: Goal: Complications related to the disease process, condition or treatment will be avoided or minimized Outcome: Progressing

## 2023-07-24 NOTE — Plan of Care (Signed)
  Problem: Education: Goal: Knowledge of [facility] Education information/materials will improve Outcome: Adequate for Discharge Goal: Emotional status will improve Outcome: Adequate for Discharge Goal: Mental status will improve Outcome: Adequate for Discharge Goal: Verbalization of understanding the information provided will improve Outcome: Adequate for Discharge   Problem: Activity: Goal: Interest or engagement in activities will improve Outcome: Adequate for Discharge Goal: Sleeping patterns will improve Outcome: Adequate for Discharge   Problem: Coping: Goal: Ability to verbalize frustrations and anger appropriately will improve Outcome: Adequate for Discharge Goal: Ability to demonstrate self-control will improve Outcome: Adequate for Discharge   Problem: Safety: Goal: Periods of time without injury will increase Outcome: Adequate for Discharge   Problem: Coping: Goal: Ability to identify and develop effective coping behavior will improve Outcome: Adequate for Discharge   Problem: Education: Goal: Knowledge of disease or condition will improve Outcome: Adequate for Discharge

## 2023-07-24 NOTE — Group Note (Signed)
Date:  07/24/2023 Time:  10:24 AM  Group Topic/Focus:  Goals Group:   The focus of this group is to help patients establish daily goals to achieve during treatment and discuss how the patient can incorporate goal setting into their daily lives to aide in recovery. Overcoming Stress:   The focus of this group is to define stress and help patients assess their triggers.    Participation Level:  Active  Participation Quality:  Appropriate  Affect:  Appropriate  Cognitive:  Alert, Appropriate, and Oriented  Insight: Appropriate  Engagement in Group:  Developing/Improving and Engaged  Modes of Intervention:  Activity, Clarification, Discussion, Education, and Support  Additional Comments:    Rosaura Carpenter 07/24/2023, 10:24 AM

## 2023-07-24 NOTE — Progress Notes (Signed)
Mood somewhat elevated, depression and anxiety mild.  Denied SI/HI and AVH.  Reported finding a place "[address] in [place]."

## 2023-07-24 NOTE — Group Note (Signed)
[facility] LCSW Group Therapy Note   Group Date: 07/24/2023 Start Time: 1300 End Time: 1400   Type of Therapy/Topic:  Group Therapy:  Emotion Regulation  Participation Level:  Did Not Attend   Mood:  Description of Group:    The purpose of this group is to assist patients in learning to regulate negative emotions and experience positive emotions. Patients will be guided to discuss ways in which they have been vulnerable to their negative emotions. These vulnerabilities will be juxtaposed with experiences of positive emotions or situations, and patients challenged to use positive emotions to combat negative ones. Special emphasis will be placed on coping with negative emotions in conflict situations, and patients will process healthy conflict resolution skills.  Therapeutic Goals: Patient will identify two positive emotions or experiences to reflect on in order to balance out negative emotions:  Patient will label two or more emotions that they find the most difficult to experience:  Patient will be able to demonstrate positive conflict resolution skills through discussion or role plays:   Summary of Patient Progress:   X    Therapeutic Modalities:   Cognitive Behavioral Therapy Feelings Identification Dialectical Behavioral Therapy   Harden Mo, LCSW

## 2023-07-24 NOTE — Progress Notes (Signed)
  [name] :  Will you be returning to the same living situation after discharge:  Yes,  Patient to return home.  At discharge, do you have transportation home?: Yes,  CSW to arrange transportation on patient's behalf.  Do you have the ability to pay for your medications: Yes, [place] MEDICAID PREPAID HEALTH PLAN / [place] MEDICAID HEALTHY BLUE   Release of information consent forms completed and in the chart;  Patient's signature needed at discharge.  Patient to Follow up at:  Follow-up Information     [place] [facility] Follow up.   Why: Your caseworker with [place] [facility] [place], [place], would like you to have her number to call if you need any assistance, [phone]        [facility]. Go to.   Why: Medication Management appointment is 08/26/23 at 10:20 AM. Contact information: [address], [place], [place] [address]  Phone Number: [phone]   Fax Number: [phone]        [facility]. Go to.   Why: Virtual appointment made for 07/31/23 at 2 PM via the phone. You will receive a call at [phone]. Contact information: [address]  [address] [place] [address] [phone]                 Next level of care provider has access to [facility] Link:no  Safety Planning and Suicide Prevention discussed: No. SPE completed with pt, as pt refused to consent to family contact. SPI pamphlet provided to pt and pt was encouraged to share information with support network, ask questions, and talk about any concerns relating to SPE. Pt denies access to guns/firearms and verbalized understanding of information provided. Mobile Crisis information also provided to pt.       Has patient been referred to the Quitline?: Patient refused referral for treatment  Patient has been referred for addiction treatment: Yes, the patient will follow up with an outpatient provider for substance use disorder.  Psychiatrist/APP: appointment made.   Lowry Ram, LCSW 07/24/2023, 9:50 AM

## 2023-07-25 NOTE — Discharge Summary (Signed)
 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 She presented to the emergency department on July 16, 2023, at 4:52 PM, expressing suicidal ideation and feelings of depression.   Principal Problem: Severe major depression, single episode, with psychotic features [facility]) Discharge Diagnoses: Principal Problem:   Severe major depression, single episode, with psychotic features (HCC) Active Problems:   Suicidal ideation   Alcohol use disorder, severe, dependence (HCC)   Esophageal dysphagia   Past Psychiatric History: see H&P  Past Medical History:  Past Medical History:  Diagnosis Date   Diabetes mellitus without complication (HCC)    Fibromyalgia    GERD (gastroesophageal reflux disease)    IBS (irritable bowel syndrome)    Thyroid disease    History reviewed. No pertinent surgical history. Family History: History reviewed. No pertinent family history. Family Psychiatric  History: see H&P Social History:  Social History   Substance and Sexual Activity  Alcohol Use Yes     Social History   Substance and Sexual Activity  Drug Use Never    Social History   Socioeconomic History   Marital status: Single    Spouse name: Not on file   Number of children: Not on file   Years of education: Not on file   Highest education level: Not on file  Occupational History   Not on file  Tobacco Use   Smoking status: Every Day    Types: Cigarettes   Smokeless tobacco: Never  Vaping Use   Vaping status: Never Used  Substance and Sexual Activity    Alcohol use: Yes   Drug use: Never   Sexual activity: Not on file  Other Topics Concern   Not on file  Social History Narrative   Not on file   Social Drivers of Health   Financial Resource Strain: Low Risk  (07/30/2022)   Received from [facility], [facility]   Overall Financial Resource Strain (CARDIA)    Difficulty of Paying Living Expenses: Not hard at all  Food Insecurity: Food Insecurity Present (07/17/2023)   Hunger Vital Sign    Worried About Running Out of Food in the Last Year: Sometimes true    Ran Out of Food in the Last Year: Sometimes true  Transportation Needs: Unmet Transportation Needs (07/17/2023)   PRAPARE - Transportation    Lack of Transportation (Medical): Yes    Lack of Transportation (Non-Medical): Yes  Physical Activity: Not on file  Stress: Stress Concern Present (01/04/2021)   Received from [facility], 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 She presented to the emergency department on July 16, 2023, at 4:52 PM, expressing suicidal ideation and feelings of depression.The patient reports a longstanding battle with depression,  which has intensified over the past several months. She describes persistent feelings of sadness, hopelessness, and a lack of interest in previously enjoyable activities. Accompanying these depressive symptoms, she has experienced auditory hallucinations and paranoia, which she has attempted to self-medicate with increased alcohol consumption.Recent significant stressors include learning about the impending foreclosure of her home and legal issues  related to multiple DWIs, resulting in a court order to serve weekend jail terms starting July 19, 2023. These events have exacerbated her depressive symptoms and alcohol use.The patient acknowledges a pattern of using alcohol as a coping mechanism for her stress and depressive symptoms. She reports multiple psychiatric inpatient admissions and unsuccessful attempts to establish consistent outpatient psychiatric care. She currently engages in weekly therapy sessions with Ms. Juanetta Gosling..Collateral information from the patient's mother indicates escalating concerns about the patient's alcohol use and its impact on her behavior. The family reports instances of the patient becoming violent during intoxication, including altercations with emergency medical services personnel. There have also been threats directed toward family members, leading to the initiation of an involuntary commitment.The patient denies any prior suicide attempts and reports a family history of substance use but no known mental illness. She identifies her family as her primary support system but acknowledges strained relationships due to her behavior.   During hospitalization, the patient had extensive initial psychiatric evaluation and psychiatric diagnoses provided upon initial assessment are as listed above. Patient was placed on alcohol detox protocol with CIWA monitoring and Librium taper. The hospitalist was consulted for difficulty swallowing and provided recommendations for dysphagia, IDDM, HTN, and diabetic neuropathy. Diabetic coordinator provided recommendations for blood sugar management. Patient was initiated on trial of Abilify as adjunct to Cymbalta (home med) for MDD. The patient denies side effects to prescribed psychotropic medication. Patient's care was discussed during the interdisciplinary team meeting every day during the hospitalization. The patient was evaluated each day by a clinical provider to ascertain response to  treatment. Improvement was noted by the patient's report of decreasing symptoms, improved sleep and appetite, affect, medication tolerance, behavior, and participation in unit programming.  Patient was asked each day to complete a self inventory noting mood, mental status, pain, new symptoms, anxiety and concerns.    Symptoms were reported as significantly decreased or resolved completely by discharge. On day of discharge, the patient reports that their mood is stable. The patient denied having suicidal thoughts for more than 48 hours prior to discharge.  Patient denies having homicidal thoughts.  Patient denies having auditory hallucinations.  Patient denies any visual hallucinations or other symptoms of psychosis. The patient was motivated to continue taking medication with a goal of continued improvement in mental health. The patient reports their target psychiatric symptoms of depression, anxiety & insomnia responded well to the psychiatric medications, and the patient reports overall benefit from this psychiatric hospitalization. Supportive psychotherapy was provided to the patient. The patient also participated in regular group therapy while hospitalized. Coping skills, problem solving as well as relaxation therapies were also part of the unit programming.The patient was able to verbalize their individual safety plan to this provider prior to discharge.  Patient was accepted to long-term substance use treatment program, [facility], in [place], [place], contingent upon patient attending appointment with gastroenterologist for endoscopy. Her appointment was rescheduled to 07/25/23 at 10am. [facility] will provide transportation.   Physical Findings: AIMS: Facial and Oral Movements Muscles of Facial Expression: Minimal, may be extreme normal Lips and Perioral Area: Minimal, may be extreme normal Jaw: Minimal,  may be extreme normal Tongue: Minimal, may be extreme normal,Extremity Movements Upper (arms,  wrists, hands, fingers): Minimal, may be extreme normal Lower (legs, knees, ankles, toes): None, Trunk Movements Neck, shoulders, hips: None, Global Judgements Severity of abnormal movements overall : Minimal, may be extreme normal Incapacitation due to abnormal movements: None Patient's awareness of abnormal movements: No Awareness, Dental Status Current problems with teeth and/or dentures?: No Does patient usually wear dentures?: No Edentia?: No  CIWA:  CIWA-Ar Total: 0 COWS:     Musculoskeletal: Strength & Muscle Tone: within normal limits Gait & Station: broad based Patient leans: N/A   Psychiatric Specialty Exam:  Presentation  General Appearance:  Appropriate for Environment; Neat; Well Groomed  Eye Contact: Good  Speech: Clear and Coherent; Slow  Speech Volume: Decreased  Handedness: Right   Mood and Affect  Mood: Anxious  Affect: Constricted; Congruent   Thought Process  Thought Processes: Coherent; Linear  Descriptions of Associations:Intact  Orientation:Full (Time, Place and Person)  Thought Content:Logical  History of Schizophrenia/Schizoaffective disorder:No  Duration of Psychotic Symptoms:N/A  Hallucinations:Hallucinations: None  Ideas of Reference:None  Suicidal Thoughts:Suicidal Thoughts: No  Homicidal Thoughts:Homicidal Thoughts: No   Sensorium  Memory: Immediate Good; Recent Good; Remote Good  Judgment: Good  Insight: Good   Executive Functions  Concentration: Good  Attention Span: Good  Recall: Good  Fund of Knowledge: Good  Language: Good   Psychomotor Activity  Psychomotor Activity: Psychomotor Activity: Decreased   Assets  Assets: Communication Skills; Desire for Improvement; Financial Resources/Insurance; Housing; Resilience; Social Support   Sleep  Sleep: Sleep: Good    Physical Exam: Physical Exam Vitals and nursing note reviewed.  HENT:     Head: Normocephalic.     Nose: Nose  normal.  Eyes:     Extraocular Movements: Extraocular movements intact.  Pulmonary:     Effort: Pulmonary effort is normal.  Musculoskeletal:        General: Normal range of motion.     Cervical back: Normal range of motion.  Neurological:     General: No focal deficit present.     Mental Status: She is oriented to person, place, and time. Mental status is at baseline.     Gait: Gait abnormal.     Comments: Due to back pain and neuropathy.  Psychiatric:        Attention and Perception: Attention and perception normal.        Mood and Affect: Mood and affect normal.        Speech: Speech normal.        Behavior: Behavior normal. Behavior is cooperative.        Thought Content: Thought content normal. Thought content is not paranoid. Thought content does not include homicidal or suicidal ideation.        Cognition and Memory: Cognition and memory normal.        Judgment: Judgment normal.    Review of Systems  Gastrointestinal:  Positive for heartburn.  Musculoskeletal:  Positive for back pain and myalgias.  Psychiatric/Behavioral:  Negative for depression and suicidal ideas. The patient is nervous/anxious. The patient does not have insomnia.    Blood pressure (!) 147/87, pulse 87, temperature 99 F (37.2 C), resp. rate 16, height 5\' 5"  (1.651 m), weight 94.8 kg, SpO2 98%. Body mass index is 34.78 kg/m.   Social History   Tobacco Use  Smoking Status Every Day   Types: Cigarettes  Smokeless Tobacco Never   Tobacco Cessation:  N/A, patient does not currently  use tobacco products   Blood Alcohol level:  Lab Results  Component Value Date   ETH 363 (HH) 07/16/2023   ETH 225 (H) 07/03/2023    Metabolic Disorder Labs:  Lab Results  Component Value Date   HGBA1C 8.4 (H) 07/18/2023   MPG 194.38 07/18/2023   No results found for: "PROLACTIN" Lab Results  Component Value Date   CHOL 145 07/18/2023   TRIG 61 07/18/2023   HDL 76 07/18/2023   CHOLHDL 1.9 07/18/2023    VLDL 12 07/18/2023   LDLCALC 57 07/18/2023    See Psychiatric Specialty Exam and Suicide Risk Assessment completed by Attending Physician prior to discharge.  Discharge destination:  Home  Is patient on multiple antipsychotic therapies at discharge:  No   Has Patient had three or more failed trials of antipsychotic monotherapy by history:  No  Recommended Plan for Multiple Antipsychotic Therapies: NA  Discharge Instructions     Diet - low sodium heart healthy   Complete by: As directed    Increase activity slowly   Complete by: As directed    Increase activity slowly   Complete by: As directed       Allergies as of 07/24/2023   No Known Allergies      Medication List     STOP taking these medications    AeroChamber MV inhaler   Armour Thyroid 120 MG tablet Generic drug: thyroid   azithromycin 250 MG tablet Commonly known as: Zithromax Z-Pak   cetirizine 10 MG tablet Commonly known as: ZYRTEC   folic acid 1 MG tablet Commonly known as: FOLVITE   levonorgestrel 20 MCG/DAY Iud Commonly known as: MIRENA   linaclotide 290 MCG Caps capsule Commonly known as: LINZESS   meloxicam 15 MG tablet Commonly known as: MOBIC   naproxen sodium 550 MG tablet Commonly known as: Anaprox DS   norethindrone 0.35 MG tablet Commonly known as: MICRONOR   omeprazole 20 MG capsule Commonly known as: PRILOSEC   ondansetron 4 MG disintegrating tablet Commonly known as: ZOFRAN-ODT   Ozempic (0.25 or 0.5 MG/DOSE) 2 MG/1.5ML Sopn Generic drug: Semaglutide(0.25 or 0.5MG /DOS)   predniSONE 10 MG (21) Tbpk tablet Commonly known as: STERAPRED UNI-PAK 21 TAB   promethazine-dextromethorphan 6.25-15 MG/5ML syrup Commonly known as: PROMETHAZINE-DM   thiamine 100 MG tablet Commonly known as: VITAMIN B1   tranexamic acid 650 MG Tabs tablet Commonly known as: LYSTEDA       TAKE these medications      Indication  Accu-Chek Guide test strip Generic drug: glucose  blood USE 1-4 TIMES DAILY AS DIRECTED BY PHYSICIAN TO MONITOR BLOOD SUGARS  Indication: Diabetes   albuterol 108 (90 Base) MCG/ACT inhaler Commonly known as: VENTOLIN HFA Inhale 2 puffs into the lungs every 4 (four) hours as needed for wheezing. What changed:  how much to take when to take this reasons to take this  Indication: Asthma   ARIPiprazole 5 MG tablet Commonly known as: ABILIFY Take 1 tablet (5 mg total) by mouth daily.  Indication: Major Depressive Disorder   aspirin EC 81 MG tablet Take 1 tablet (81 mg total) by mouth daily. Swallow whole. What changed: additional instructions  Indication: Muscle Pain   DULoxetine 60 MG capsule Commonly known as: CYMBALTA Take 1 capsule (60 mg total) by mouth at bedtime. What changed:  when to take this Another medication with the same name was removed. Continue taking this medication, and follow the directions you see here.  Indication: Major Depressive Disorder, Musculoskeletal Pain  fluticasone 50 MCG/ACT nasal spray Commonly known as: FLONASE Place 2 sprays into both nostrils daily.  Indication: Stuffy Nose   gabapentin 400 MG capsule Commonly known as: NEURONTIN Take 1 capsule (400 mg total) by mouth at bedtime. What changed: You were already taking a medication with the same name, and this prescription was added. Make sure you understand how and when to take each.  Indication: Fibromyalgia Syndrome   gabapentin 100 MG capsule Commonly known as: NEURONTIN Take 1 capsule (100 mg total) by mouth daily. What changed:  medication strength how much to take  Indication: Fibromyalgia Syndrome   ipratropium 0.06 % nasal spray Commonly known as: ATROVENT Place 2 sprays into both nostrils 4 (four) times daily.  Indication: Hayfever   levothyroxine 50 MCG tablet Commonly known as: SYNTHROID Take 1 tablet (50 mcg total) by mouth daily at 6 (six) AM. What changed: when to take this  Indication: Underactive Thyroid    lisinopril 10 MG tablet Commonly known as: ZESTRIL Take 1 tablet (10 mg total) by mouth daily.  Indication: High Blood Pressure   melatonin 5 MG Tabs Take 1 tablet (5 mg total) by mouth at bedtime.  Indication: Trouble Sleeping   metFORMIN 1000 MG tablet Commonly known as: GLUCOPHAGE Take 1 tablet (1,000 mg total) by mouth 2 (two) times daily before a meal. What changed:  medication strength how much to take when to take this  Indication: Type 2 Diabetes   methocarbamol 500 MG tablet Commonly known as: ROBAXIN Take 1 tablet (500 mg total) by mouth 2 (two) times daily.  Indication: Musculoskeletal Pain   metoprolol succinate 25 MG 24 hr tablet Commonly known as: TOPROL-XL Take 1 tablet (25 mg total) by mouth daily.  Indication: High Blood Pressure   multivitamin with minerals Tabs tablet Take 1 tablet by mouth daily.  Indication: Nutritional Support   pantoprazole 40 MG tablet Commonly known as: PROTONIX Take 1 tablet (40 mg total) by mouth 2 (two) times daily before a meal. What changed: when to take this  Indication: Gastroesophageal Reflux Disease   phenol 1.4 % Liqd Commonly known as: CHLORASEPTIC Use as directed 1 spray in the mouth or throat as needed for throat irritation / pain.  Indication: Laryngitis   potassium chloride 10 MEQ tablet Commonly known as: KLOR-CON Take 2 tablets (20 mEq total) by mouth at bedtime. What changed:  how much to take when to take this  Indication: Low Amount of Potassium in the Blood   sucralfate 1 g tablet Commonly known as: CARAFATE Take 1 tablet (1 g total) by mouth 4 (four) times daily -  with meals and at bedtime.  Indication: Gastroesophageal Reflux Disease   thiamine 100 MG tablet Commonly known as: Vitamin B-1 Take 1 tablet (100 mg total) by mouth daily.  Indication: Deficiency of Vitamin B1   traZODone 100 MG tablet Commonly known as: DESYREL Take 1.5 tablets (150 mg total) by mouth at bedtime. What  changed:  medication strength See the new instructions. Another medication with the same name was removed. Continue taking this medication, and follow the directions you see here.  Indication: Trouble Sleeping        Follow-up Information     [place] [facility] Follow up.   Why: Your caseworker with  [employer] [place], [place], would like you to have her number to call if you need any assistance, [phone]        [facility]. Go to.   Why: Medication  Management appointment is 08/26/23 at 10:20 AM. Contact information: [address] [place], [place] [address]  Phone Number: [phone]   Fax Number: [phone]        [facility]. Go to.   Why: Virtual appointment made for 07/31/23 at 2 PM via the phone. You will receive a call at [phone]. Contact information: [address]  [address] [place] [address] [phone]                 Follow-up recommendations: It is recommended to the patient to continue psychiatric medications as prescribed, after discharge from the hospital.   - It is recommended to the patient to follow up with your outpatient psychiatric provider and PCP. - It was discussed with the patient, the impact of alcohol, drugs, tobacco have been there overall psychiatric and medical wellbeing, and total abstinence from substance use was recommended the patient. - 30-day supply of medication and prescription provided at discharge. Patient agreeable to plan. Given opportunity to ask questions. Appears to feel comfortable with discharge.   - In the event of worsening symptoms, the patient is instructed to call the crisis hotline, 911 and or go to the nearest ED for appropriate evaluation and treatment of symptoms. To follow-up with primary care provider for other medical issues, concerns and or health care needs - Patient was discharged home, then to [facility],  with a plan to follow up as noted above.    Signed: Petar Mucci Robyne Peers, NP

## 2023-07-26 ENCOUNTER — Encounter: Payer: Self-pay | Admitting: Nurse Practitioner

## 2023-08-24 IMAGING — CR DG ELBOW COMPLETE 3+V*R*
4 series · 4 of 4 positions shown · non-contrast
Comparison: None Available.

CLINICAL DATA: 41-year-old female with persistent pain for 3
months. No known injury.

EXAM:
RIGHT ELBOW - COMPLETE 3+ VIEW

[elbow ap]
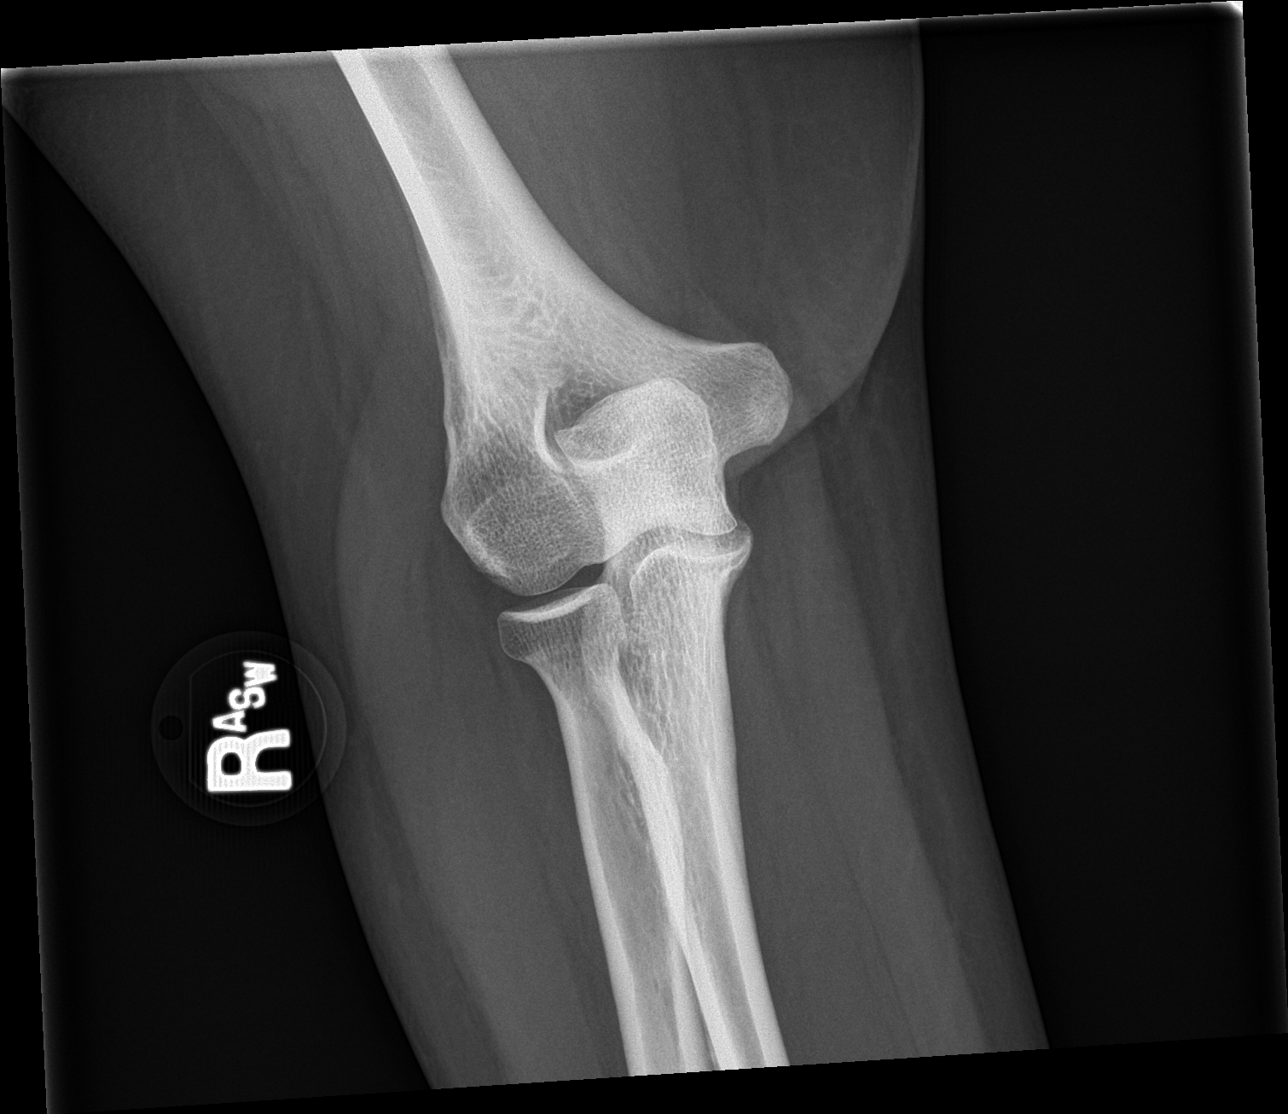

[elbow obl (1 of 2)]
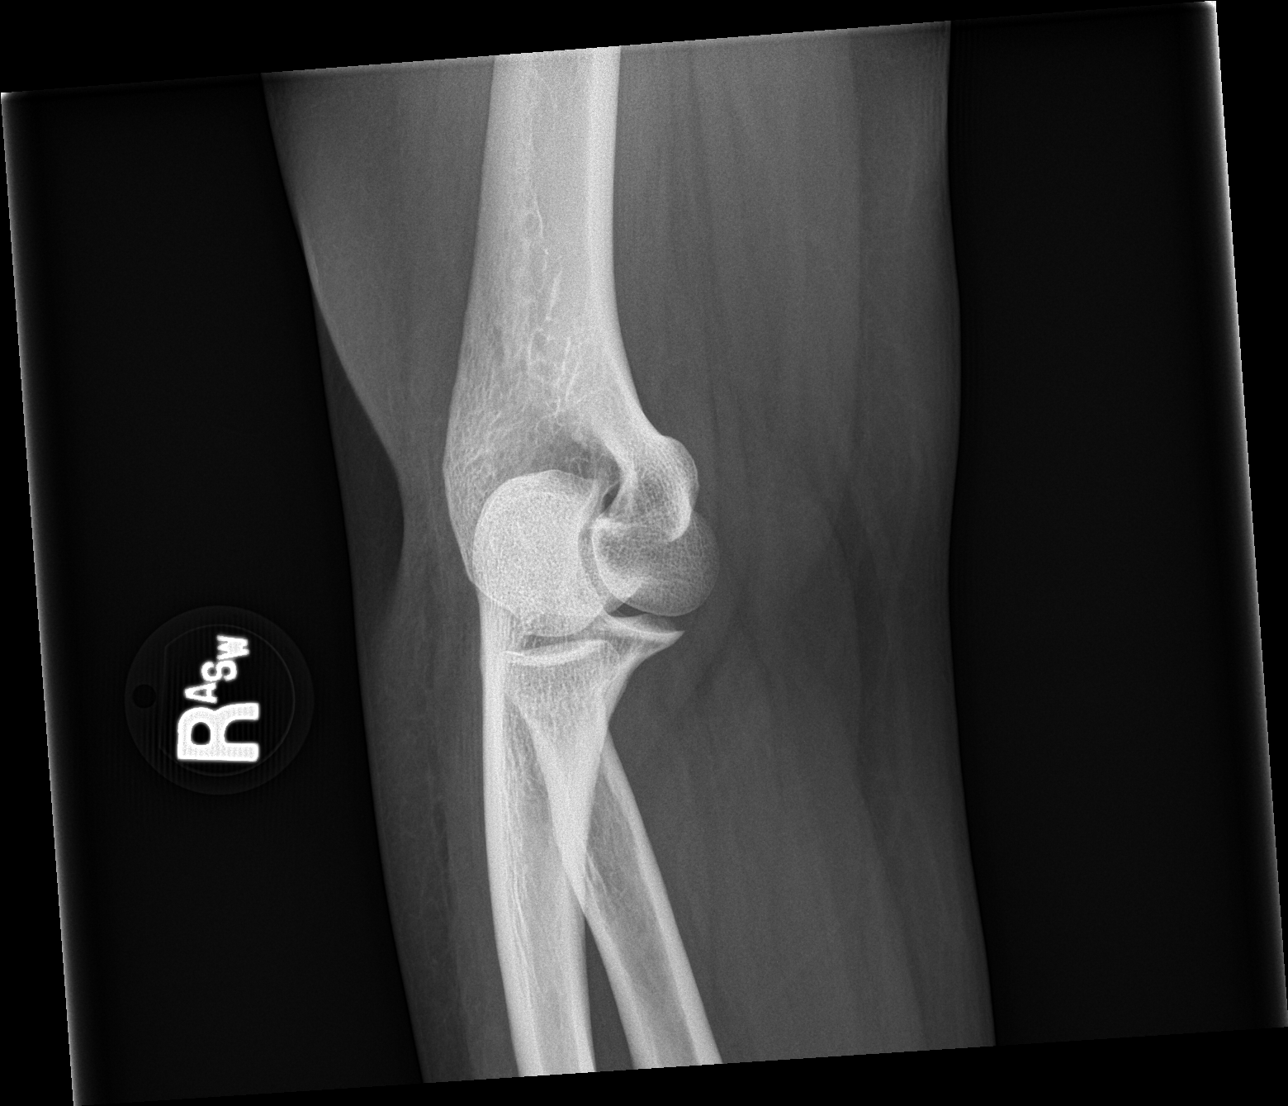

[elbow obl (2 of 2)]
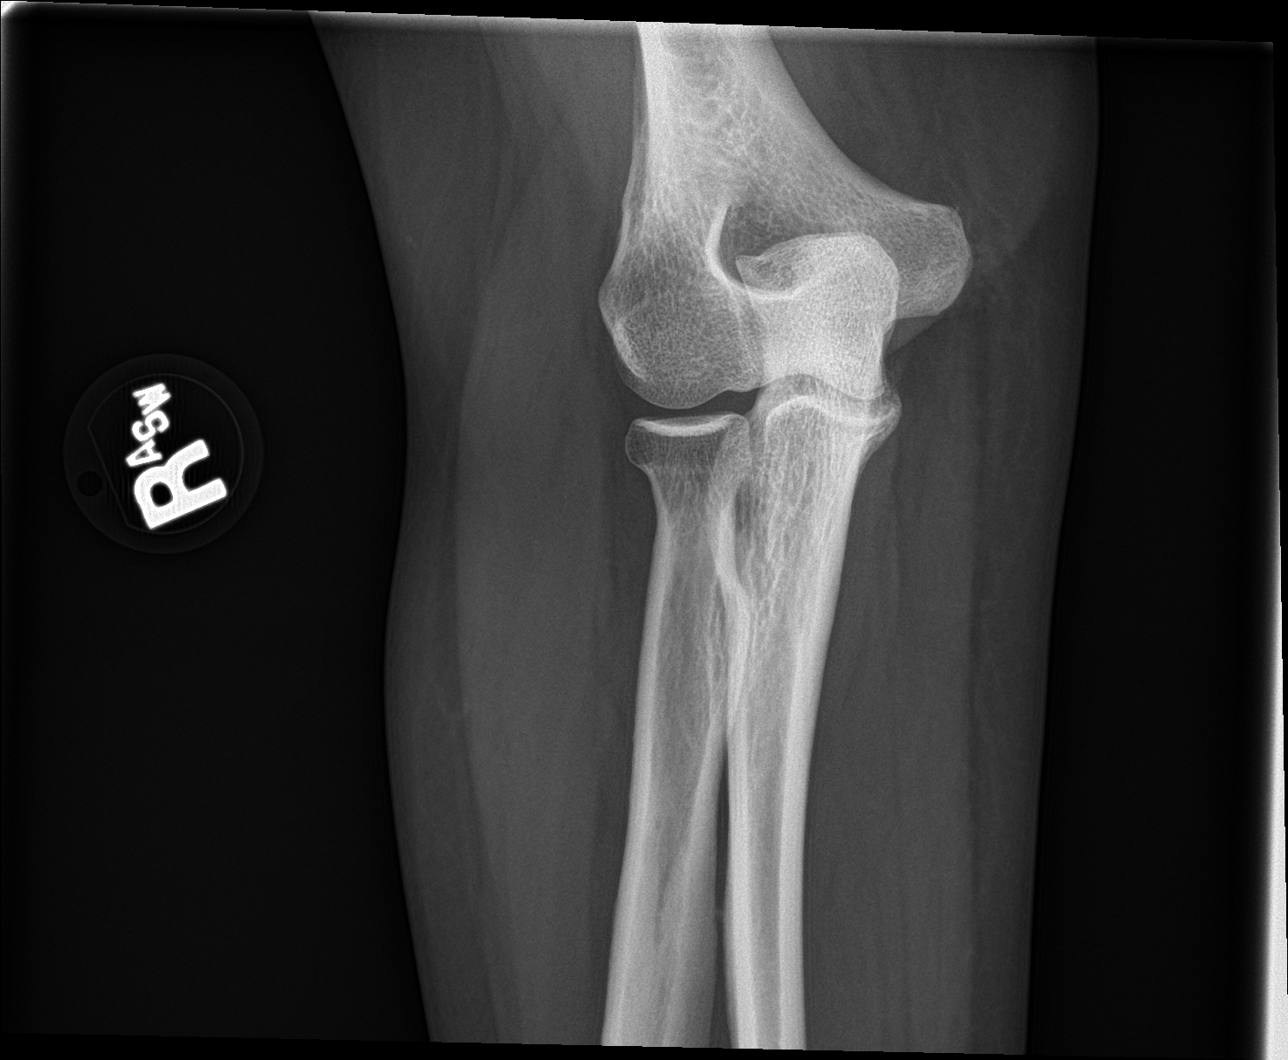

[elbow lat]
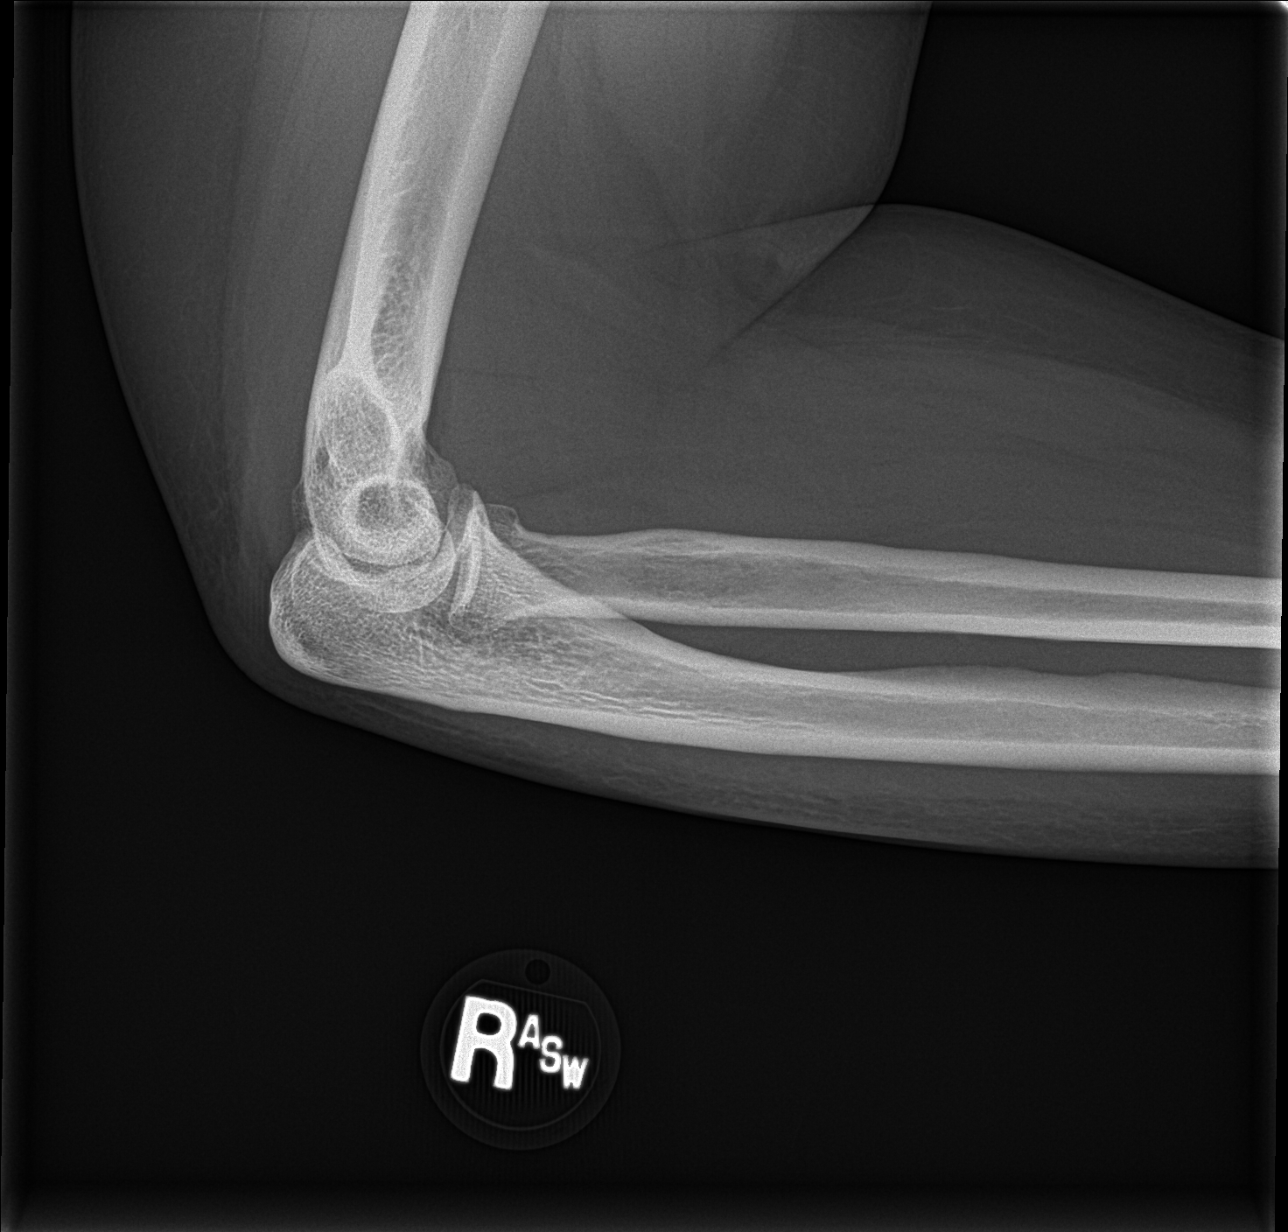

[4 of 4 positions shown; findings below may reference images not displayed]

FINDINGS: Bone mineralization is within normal limits. There is no evidence of
fracture, dislocation, or joint effusion. There is no evidence of
arthropathy or other focal bone abnormality. Soft tissues are
unremarkable.
IMPRESSION: Negative.

## 2024-04-06 ENCOUNTER — Ambulatory Visit
Admission: EM | Admit: 2024-04-06 | Discharge: 2024-04-06 | Disposition: A | Discharge status: Home / Self Care | Attending: Family Medicine | Admitting: Family Medicine

## 2024-04-06 ENCOUNTER — Ambulatory Visit: Payer: Self-pay | Admitting: Family Medicine

## 2024-04-06 ENCOUNTER — Ambulatory Visit (INDEPENDENT_AMBULATORY_CARE_PROVIDER_SITE_OTHER)

## 2024-04-06 DIAGNOSIS — M25511 Pain in right shoulder: Secondary | ICD-10-CM | POA: Diagnosis not present

## 2024-04-06 MED ORDER — KETOROLAC TROMETHAMINE 60 MG/2ML IM SOLN
30.0000 mg | Freq: Once | INTRAMUSCULAR | Status: AC
  Administered 2024-04-06: 30 mg via INTRAMUSCULAR

## 2024-04-06 MED ORDER — KETOROLAC TROMETHAMINE 30 MG/ML IJ SOLN
30.0000 mg | Freq: Once | INTRAMUSCULAR | 0 refills | Status: DC
Start: 2024-04-06 — End: 2024-04-06

## 2024-04-06 MED ORDER — CYCLOBENZAPRINE HCL 5 MG PO TABS: 5.0000 mg | ORAL_TABLET | Freq: Three times a day (TID) | ORAL | 0 refills | Status: AC | PRN

## 2024-04-06 MED ORDER — PREDNISONE 10 MG (21) PO TBPK: ORAL_TABLET | Freq: Every day | ORAL | 0 refills | Status: AC

## 2024-04-06 NOTE — ED Provider Notes (Signed)
 [facility]    CSN: [id number] Arrival date & time: 04/06/24  1509      History   Chief Complaint Chief Complaint  Patient presents with   Shoulder Pain    HPI  HPI Angela Landry is a 43 y.o. female.   Angela Landry presents for constat deep ache right shoulder pain for the past 6-7 weeks. Thought she slept on her shoulder wrong. Has some anterior and lateral shoulder pain.Rolling on it at night makes her holler.  No falls, trauma, injury or heavy lifting. No previous similar pain. Taking ibuprofen  rubbing Bengay and applying ice packs without significant relief.  Pain is gradually getting worse. She is left handed but uses her right hand to manage her mouse.      Past Medical History:  Diagnosis Date   Diabetes mellitus without complication (HCC)    Fibromyalgia    GERD (gastroesophageal reflux disease)    IBS (irritable bowel syndrome)    Thyroid disease     Patient Active Problem List   Diagnosis Date Noted   Esophageal dysphagia 07/21/2023   Severe major depression, single episode, with psychotic features (HCC) 07/18/2023   Suicidal ideation 07/17/2023   Alcohol use disorder, severe, dependence (HCC) 07/17/2023   Adjustment disorder with mixed anxiety and depressed mood 07/17/2023    History reviewed. No pertinent surgical history.  OB History   No obstetric history on file.      Home Medications    Prior to Admission medications   Medication Sig Start Date End Date Taking? Authorizing Provider  ARIPiprazole  (ABILIFY ) 5 MG tablet Take 1 tablet (5 mg total) by mouth daily. 07/25/23  Yes Remington, Amber E, NP  buPROPion (WELLBUTRIN XL) 150 MG 24 hr tablet Take 150 mg by mouth every morning. 03/27/24  Yes [provider]  cyclobenzaprine  (FLEXERIL ) 5 MG tablet Take 1 tablet (5 mg total) by mouth 3 (three) times daily as needed. 04/06/24  Yes Tongela Encinas, DO  DULoxetine  (CYMBALTA ) 60 MG capsule Take 1 capsule (60 mg total) by mouth  at bedtime. 07/24/23  Yes Remington, Amber E, NP  gabapentin  (NEURONTIN ) 400 MG capsule Take 1 capsule (400 mg total) by mouth at bedtime. 07/24/23  Yes Remington, Amber E, NP  hydrOXYzine  (ATARAX ) 10 MG tablet Take 10-20 mg by mouth every 6 (six) hours as needed. 03/27/24  Yes [provider]  hydrOXYzine  (VISTARIL ) 50 MG capsule Take 50 mg by mouth. 09/06/23  Yes [provider]  levothyroxine  (SYNTHROID ) 50 MCG tablet Take 1 tablet (50 mcg total) by mouth daily at 6 (six) AM. 07/25/23  Yes Remington, Amber E, NP  lisinopril  (ZESTRIL ) 10 MG tablet Take 1 tablet (10 mg total) by mouth daily. 07/24/23 07/23/24 Yes Remington, Amber E, NP  metoprolol  succinate (TOPROL -XL) 25 MG 24 hr tablet Take 1 tablet (25 mg total) by mouth daily. 07/25/23  Yes Remington, Amber E, NP  OZEMPIC, 0.25 OR 0.5 MG/DOSE, 2 MG/3ML SOPN Inject 0.5 mg into the skin once a week.   Yes [provider]  predniSONE  (STERAPRED UNI-PAK 21 TAB) 10 MG (21) TBPK tablet Take by mouth daily. Take 6 tabs by mouth daily for 1, then 5 tabs for 1 day, then 4 tabs for 1 day, then 3 tabs for 1 day, then 2 tabs for 1 day, then 1 tab for 1 day. 04/06/24  Yes Patton Rabinovich, DO  sucralfate  (CARAFATE ) 1 g tablet Take 1 tablet (1 g total) by mouth 4 (four) times daily -  with meals and at bedtime. 07/24/23  Yes Remington, Amber E, NP  VIVITROL 380 MG SUSR IM injection Inject into the muscle. 03/18/24  Yes [provider]  ACCU-CHEK GUIDE test strip USE 1-4 TIMES DAILY AS DIRECTED BY PHYSICIAN TO MONITOR BLOOD SUGARS 10/10/20   [provider]  albuterol  (VENTOLIN  HFA) 108 (90 Base) MCG/ACT inhaler Inhale 2 puffs into the lungs every 4 (four) hours as needed for wheezing. 07/24/23   Remington, Amber E, NP  aspirin  EC 81 MG tablet Take 1 tablet (81 mg total) by mouth daily. Swallow whole. 07/25/23   Remington, Amber E, NP  fluticasone  (FLONASE ) 50 MCG/ACT nasal spray Place 2 sprays into both nostrils daily. 07/24/23  07/23/24  Remington, Amber E, NP  gabapentin  (NEURONTIN ) 100 MG capsule Take 1 capsule (100 mg total) by mouth daily. 07/25/23   Remington, Amber E, NP  ipratropium (ATROVENT ) 0.06 % nasal spray Place 2 sprays into both nostrils 4 (four) times daily. 07/24/23   Remington, Amber E, NP  melatonin 5 MG TABS Take 1 tablet (5 mg total) by mouth at bedtime. 07/24/23   Remington, Amber E, NP  metFORMIN  (GLUCOPHAGE ) 1000 MG tablet Take 1 tablet (1,000 mg total) by mouth 2 (two) times daily before a meal. 07/24/23   Remington, [facility] E, NP  methocarbamol  (ROBAXIN ) 500 MG tablet Take 1 tablet (500 mg total) by mouth 2 (two) times daily. 07/24/23   Remington, Amber E, NP  Multiple Vitamin (MULTIVITAMIN WITH MINERALS) TABS tablet Take 1 tablet by mouth daily. 07/25/23   Remington, Amber E, NP  pantoprazole  (PROTONIX ) 40 MG tablet Take 1 tablet (40 mg total) by mouth 2 (two) times daily before a meal. 07/24/23   Remington, Amber E, NP  phenol (CHLORASEPTIC) 1.4 % LIQD Use as directed 1 spray in the mouth or throat as needed for throat irritation / pain. 07/24/23   Remington, Amber E, NP  potassium chloride  (KLOR-CON ) 10 MEQ tablet Take 2 tablets (20 mEq total) by mouth at bedtime. 07/24/23   Remington, Amber E, NP  thiamine  (VITAMIN B-1) 100 MG tablet Take 1 tablet (100 mg total) by mouth daily. 07/25/23   Remington, Amber E, NP  traZODone  (DESYREL ) 100 MG tablet Take 1.5 tablets (150 mg total) by mouth at bedtime. 07/24/23   Remington, Amber E, NP    Family History History reviewed. No pertinent family history.  Social History Social History   Tobacco Use   Smoking status: Every Day    Types: Cigarettes   Smokeless tobacco: Never  Vaping Use   Vaping status: Never Used  Substance Use Topics   Alcohol use: Yes   Drug use: Never     Allergies   Patient has no known allergies.   Review of Systems Review of Systems: :negative unless otherwise stated in HPI.      Physical Exam Triage Vital Signs ED  Triage Vitals  Encounter Vitals Group     BP 04/06/24 1537 117/88     Girls Systolic BP Percentile --      Girls Diastolic BP Percentile --      Boys Systolic BP Percentile --      Boys Diastolic BP Percentile --      Pulse Rate 04/06/24 1537 96     Resp 04/06/24 1537 18     Temp 04/06/24 1537 98.8 F (37.1 C)     Temp Source 04/06/24 1537 Oral     SpO2 04/06/24 1537 100 %     Weight  04/06/24 1535 230 lb (104.3 kg)     Height --      Head Circumference --      Peak Flow --      Pain Score 04/06/24 1535 4     Pain Loc --      Pain Education --      Exclude from Growth Chart --    No data found.  Updated Vital Signs BP 117/88 (BP Location: Left Arm)   Pulse 96   Temp 98.8 F (37.1 C) (Oral)   Resp 18   Wt 104.3 kg   SpO2 100%   BMI 38.27 kg/m   Visual Acuity Right Eye Distance:   Left Eye Distance:   Bilateral Distance:    Right Eye Near:   Left Eye Near:    Bilateral Near:     Physical Exam GEN: well appearing female in no acute distress  CVS: well perfused  RESP: speaking in full sentences without pause, no respiratory distress  MSK:  Right shoulder:  No evidence of bony deformity, asymmetry, or muscle atrophy. No tenderness over long head of biceps (bicipital groove).  +TTP at [facility] joint.  Limited active and passive (ABD, extension, IR, ER). Limited  2/2 to pain. Good Adduction  Strength 5/5 grip, elbow and shoulder. No abnormal scapular function observed.  Special Tests: Hawkins: positive; Empty Can: negative, Neer's: unable to complete; Painful arc: unable to complete; Anterior Apprehension: Negative Sensation intact. Peripheral pulses intact.   UC Treatments / Results  Labs (all labs ordered are listed, but only abnormal results are displayed) Labs Reviewed - No data to display  EKG   Radiology DG Shoulder Right Result Date: 04/06/2024 CLINICAL DATA:  Pain for 1-1/2 months.  No known injury. EXAM: RIGHT SHOULDER - 2+ VIEW COMPARISON:  None  Available. FINDINGS: Axillary view is limited due to positioning. There is no evidence of fracture or dislocation. Os acromial, typically incidental. There is no evidence of arthropathy or other focal bone abnormality. Soft tissues are unremarkable. IMPRESSION: No radiographic explanation for pain. Os acromial is typically incidental. Electronically Signed   By: Andrea Gasman M.D.   On: 04/06/2024 17:02     Procedures Procedures (including critical care time)  Medications Ordered in UC Medications  ketorolac (TORADOL) injection 30 mg (30 mg Intramuscular Given 04/06/24 1605)    Initial Impression / Assessment and Plan / UC Course  I have reviewed the triage vital signs and the nursing notes.  Pertinent labs & imaging results that were available during my care of the patient were reviewed by me and considered in my medical decision making (see chart for details).      Pt is a 43 y.o.  female with 6-7 weeks of right shoulder pain without known injury.  VSS.  PO vs IM injection discussed and patient choose IM Toradol which was given.  On exam, pt has tenderness at [facility] joint concerning for shoulder impingement, bursitis, rotator cuff injury or osteoarthritis.   Obtained right shoulder plain films.  Personally interpreted by me were unremarkable for fracture or dislocation. Patient aware the radiologist has not read her xray and is comfortable with the preliminary read by me. Will review radiologist read when available and call patient if a change in plan is warranted.  Pt agreeable to this plan prior to discharge.   Patient to gradually return to normal activities, as tolerated and continue ordinary activities within the limits permitted by pain. Prescribed prednisone  taper and muscle relaxer  for  pain relief.  Tylenol  PRN. Advised patient to avoid OTC NSAIDs while taking prescription NSAID. Counseled patient on red flag symptoms and when to seek immediate care.    Patient to follow up with  orthopedic provider, if symptoms do not improve with conservative treatment.  Return and ED precautions given. Understanding voiced. Discussed MDM, treatment plan and plan for follow-up with patient who agrees with plan.   Final Clinical Impressions(s) / UC Diagnoses   Final diagnoses:  Acute pain of right shoulder     Discharge Instructions      On my review of your xray images, you did not have any fractures or dislocated bones. The radiologist has not yet read your xray. If it is significantly abnormal or urgent, someone will contact you.  You should see your results in MyChart.   Be sure to take your arm out of your shoulder sling every few hours to perform the pendulum exercises that we discussed.    You have a condition requiring you to follow up with Orthopedics,  if not improving call one of the following office for appointment:   [facility] [address], [place], [place] [address] Phone: [phone]  [facility] [address], [place], [place] [address] Phone: [phone]      ED Prescriptions     Medication Sig Dispense Auth. Provider   predniSONE  (STERAPRED UNI-PAK 21 TAB) 10 MG (21) TBPK tablet Take by mouth daily. Take 6 tabs by mouth daily for 1, then 5 tabs for 1 day, then 4 tabs for 1 day, then 3 tabs for 1 day, then 2 tabs for 1 day, then 1 tab for 1 day. 21 tablet Zavon Hyson, DO   cyclobenzaprine  (FLEXERIL ) 5 MG tablet Take 1 tablet (5 mg total) by mouth 3 (three) times daily as needed. 30 tablet Iasiah Ozment, DO      PDMP not reviewed this encounter.   Jacinda Kanady, DO 04/08/24 [phone]

## 2024-04-06 NOTE — Discharge Instructions (Addendum)
 On my review of your xray images, you did not have any fractures or dislocated bones. The radiologist has not yet read your xray. If it is significantly abnormal or urgent, someone will contact you.  You should see your results in MyChart.   Be sure to take your arm out of your shoulder sling every few hours to perform the pendulum exercises that we discussed.    You have a condition requiring you to follow up with Orthopedics,  if not improving call one of the following office for appointment:   [facility] [address] [place], [place] [address] Phone: [phone]  [facility] [address], [place], [place] [address] Phone: [phone]

## 2024-04-06 NOTE — ED Triage Notes (Signed)
 Patient states that she's having right shoulder pain for 1.5 moths. No injury. Hurts to lay on it and can't lift her arm.

## 2024-05-10 ENCOUNTER — Telehealth: Admitting: Family

## 2024-05-10 DIAGNOSIS — L0291 Cutaneous abscess, unspecified: Secondary | ICD-10-CM | POA: Diagnosis not present

## 2024-05-10 MED ORDER — SULFAMETHOXAZOLE-TRIMETHOPRIM 800-160 MG PO TABS: 1.0000 | ORAL_TABLET | Freq: Two times a day (BID) | ORAL | 0 refills | Status: AC

## 2024-05-10 NOTE — Progress Notes (Signed)
 Virtual Visit Consent   Angela Landry, you are scheduled for a virtual visit with a [facility] provider today. Just as with appointments in the office, your consent must be obtained to participate. Your consent will be active for this visit and any virtual visit you may have with one of our providers in the next 365 days. If you have a MyChart account, a copy of this consent can be sent to you electronically.  As this is a virtual visit, video technology does not allow for your provider to perform a traditional examination. This may limit your provider's ability to fully assess your condition. If your provider identifies any concerns that need to be evaluated in person or the need to arrange testing (such as labs, EKG, etc.), we will make arrangements to do so. Although advances in technology are sophisticated, we cannot ensure that it will always work on either your end or our end. If the connection with a video visit is poor, the visit may have to be switched to a telephone visit. With either a video or telephone visit, we are not always able to ensure that we have a secure connection.  By engaging in this virtual visit, you consent to the provision of healthcare and authorize for your insurance to be billed (if applicable) for the services provided during this visit. Depending on your insurance coverage, you may receive a charge related to this service.  I need to obtain your verbal consent now. Are you willing to proceed with your visit today? [name] has provided verbal consent on 05/10/2024 for a virtual visit (video or telephone). Bari Learn, FNP  Date: 05/10/2024 12:58 PM   Virtual Visit via Video Note   I, Bari Learn, connected with  Cassandre Oleksy  ([id number], June 26, 1980) on 05/10/24 at 12:45 PM EST by a video-enabled telemedicine application and verified that I am speaking with the correct person using two identifiers.  Location: Patient: Virtual Visit Location  Patient: Home Provider: Virtual Visit Location Provider: Home Office   I discussed the limitations of evaluation and management by telemedicine and the availability of in person appointments. The patient expressed understanding and agreed to proceed.    History of Present Illness: Angela Landry is a 43 y.o. who identifies as a female who was assigned female at birth, and is being seen today for abscess under right breast that she noticed 3 days ago. Reports the area is red, hot, and draining a thick white yellow discharge. Reports tenderness of 4 out 10.    HPI: HPI  Problems:  Patient Active Problem List   Diagnosis Date Noted   Esophageal dysphagia 07/21/2023   Severe major depression, single episode, with psychotic features (HCC) 07/18/2023   Suicidal ideation 07/17/2023   Alcohol use disorder, severe, dependence (HCC) 07/17/2023   Adjustment disorder with mixed anxiety and depressed mood 07/17/2023    Allergies: No Known Allergies Medications:  Current Outpatient Medications:    sulfamethoxazole-trimethoprim  (BACTRIM DS) 800-160 MG tablet, Take 1 tablet by mouth 2 (two) times daily., Disp: 14 tablet, Rfl: 0   ACCU-CHEK GUIDE test strip, USE 1-4 TIMES DAILY AS DIRECTED BY PHYSICIAN TO MONITOR BLOOD SUGARS, Disp: , Rfl:    albuterol  (VENTOLIN  HFA) 108 (90 Base) MCG/ACT inhaler, Inhale 2 puffs into the lungs every 4 (four) hours as needed for wheezing., Disp: 8 g, Rfl: 0   ARIPiprazole  (ABILIFY ) 5 MG tablet, Take 1 tablet (5 mg total) by mouth daily., Disp: 30 tablet, Rfl: 0   aspirin   EC 81 MG tablet, Take 1 tablet (81 mg total) by mouth daily. Swallow whole., Disp: 30 tablet, Rfl: 0   buPROPion (WELLBUTRIN XL) 150 MG 24 hr tablet, Take 150 mg by mouth every morning., Disp: , Rfl:    cyclobenzaprine  (FLEXERIL ) 5 MG tablet, Take 1 tablet (5 mg total) by mouth 3 (three) times daily as needed., Disp: 30 tablet, Rfl: 0   DULoxetine  (CYMBALTA ) 60 MG capsule, Take 1 capsule (60 mg total) by  mouth at bedtime., Disp: 30 capsule, Rfl: 0   fluticasone  (FLONASE ) 50 MCG/ACT nasal spray, Place 2 sprays into both nostrils daily., Disp: 16 g, Rfl: 0   gabapentin  (NEURONTIN ) 100 MG capsule, Take 1 capsule (100 mg total) by mouth daily., Disp: 30 capsule, Rfl: 0   gabapentin  (NEURONTIN ) 400 MG capsule, Take 1 capsule (400 mg total) by mouth at bedtime., Disp: 30 capsule, Rfl: 0   hydrOXYzine  (ATARAX ) 10 MG tablet, Take 10-20 mg by mouth every 6 (six) hours as needed., Disp: , Rfl:    hydrOXYzine  (VISTARIL ) 50 MG capsule, Take 50 mg by mouth., Disp: , Rfl:    ipratropium (ATROVENT ) 0.06 % nasal spray, Place 2 sprays into both nostrils 4 (four) times daily., Disp: 15 mL, Rfl: 0   levothyroxine  (SYNTHROID ) 50 MCG tablet, Take 1 tablet (50 mcg total) by mouth daily at 6 (six) AM., Disp: 30 tablet, Rfl: 0   lisinopril  (ZESTRIL ) 10 MG tablet, Take 1 tablet (10 mg total) by mouth daily., Disp: 30 tablet, Rfl: 0   melatonin 5 MG TABS, Take 1 tablet (5 mg total) by mouth at bedtime., Disp: 30 tablet, Rfl: 0   metFORMIN  (GLUCOPHAGE ) 1000 MG tablet, Take 1 tablet (1,000 mg total) by mouth 2 (two) times daily before a meal., Disp: 60 tablet, Rfl: 0   methocarbamol  (ROBAXIN ) 500 MG tablet, Take 1 tablet (500 mg total) by mouth 2 (two) times daily., Disp: 60 tablet, Rfl: 0   metoprolol  succinate (TOPROL -XL) 25 MG 24 hr tablet, Take 1 tablet (25 mg total) by mouth daily., Disp: 30 tablet, Rfl: 0   Multiple Vitamin (MULTIVITAMIN WITH MINERALS) TABS tablet, Take 1 tablet by mouth daily., Disp: 30 tablet, Rfl: 0   OZEMPIC, 0.25 OR 0.5 MG/DOSE, 2 MG/3ML SOPN, Inject 0.5 mg into the skin once a week., Disp: , Rfl:    pantoprazole  (PROTONIX ) 40 MG tablet, Take 1 tablet (40 mg total) by mouth 2 (two) times daily before a meal., Disp: 60 tablet, Rfl: 0   phenol (CHLORASEPTIC) 1.4 % LIQD, Use as directed 1 spray in the mouth or throat as needed for throat irritation / pain., Disp: 177 mL, Rfl: 0   potassium chloride   (KLOR-CON ) 10 MEQ tablet, Take 2 tablets (20 mEq total) by mouth at bedtime., Disp: 60 tablet, Rfl: 0   predniSONE  (STERAPRED UNI-PAK 21 TAB) 10 MG (21) TBPK tablet, Take by mouth daily. Take 6 tabs by mouth daily for 1, then 5 tabs for 1 day, then 4 tabs for 1 day, then 3 tabs for 1 day, then 2 tabs for 1 day, then 1 tab for 1 day., Disp: 21 tablet, Rfl: 0   sucralfate  (CARAFATE ) 1 g tablet, Take 1 tablet (1 g total) by mouth 4 (four) times daily -  with meals and at bedtime., Disp: 42 tablet, Rfl: 0   thiamine  (VITAMIN B-1) 100 MG tablet, Take 1 tablet (100 mg total) by mouth daily., Disp: 30 tablet, Rfl: 0   traZODone  (DESYREL ) 100 MG tablet, Take 1.5  tablets (150 mg total) by mouth at bedtime., Disp: 45 tablet, Rfl: 0   VIVITROL 380 MG SUSR IM injection, Inject into the muscle., Disp: , Rfl:   Observations/Objective: Patient is well-developed, well-nourished in no acute distress.  Resting comfortably  at home.  Head is normocephalic, atraumatic.  No labored breathing.  Speech is clear and coherent with logical content.  Patient is alert and oriented at baseline.  Under right breast small area that is draining thick white discharge. Skin around erythemas and tender.  Assessment and Plan: 1. Abscess (Primary) - sulfamethoxazole-trimethoprim  (BACTRIM DS) 800-160 MG tablet; Take 1 tablet by mouth 2 (two) times daily.  Dispense: 14 tablet; Refill: 0  Hot compressions Keep clean and dry  Avoid picking  Good hand hygiene Follow up if symptoms worsen or do not improve   Follow Up Instructions: I discussed the assessment and treatment plan with the patient. The patient was provided an opportunity to ask questions and all were answered. The patient agreed with the plan and demonstrated an understanding of the instructions.  A copy of instructions were sent to the patient via MyChart unless otherwise noted below.     The patient was advised to call back or seek an in-person evaluation if the  symptoms worsen or if the condition fails to improve as anticipated.    Bari Learn, FNP

## 2024-05-10 NOTE — Patient Instructions (Signed)
 Skin Abscess  A skin abscess is an infected area on or under your skin. It contains pus and other material. An abscess may also be called a furuncle, carbuncle, or boil. It is often the result of an infection caused by bacteria. An abscess can occur in or on almost any part of your body. Sometimes, an abscess may break open (rupture) on its own. In most cases, it will keep getting worse unless it is treated. An abscess can cause pain and make you feel ill. An untreated abscess can cause infection to spread to other parts of your body or your bloodstream. The abscess may need to be drained. You may also need to take antibiotics. What are the causes? An abscess occurs when germs, like bacteria, pass through your skin and cause an infection. This may be caused by: A scrape or cut on your skin. A puncture wound through your skin, such as a needle injection or insect bite. Blocked oil or sweat glands. Blocked and infected hair follicles. A fluid-filled sac that forms beneath your skin (sebaceous cyst) and becomes infected. What increases the risk? You may be more likely to develop an abscess if: You have problems with blood circulation, or you have a weak body defense system (immune system). You have diabetes. You have dry and irritated skin. You get injections often or use IV drugs. You have a foreign body in a wound, such as a splinter. You smoke or use tobacco products. What are the signs or symptoms? Symptoms of this condition include: A painful, firm bump under the skin. A bump with pus at the top. This may break through the skin and drain. Other symptoms include: Redness and swelling around the abscess. Warmth or tenderness. Swelling of the lymph nodes (glands) near the abscess. A sore on the skin. How is this diagnosed? This condition may be diagnosed based on a physical exam and your medical history. You may also have tests done, such as: A test of a sample of pus. This may be done  to find what is causing the infection. Blood tests. Imaging tests, such as an ultrasound, CT scan, or MRI. How is this treated? A small abscess that drains on its own may not need to be treated. Treatment for larger abscesses may include: Moist heat or a heat pack applied to the area a few times a day. Incision and drainage. This is a procedure to drain the abscess. Antibiotics. For a severe abscess, you may first get antibiotics through an IV and then change to antibiotics by mouth. Follow these instructions at home: Medicines Take over-the-counter and prescription medicines only as told by your provider. If you were prescribed antibiotics, take them as told by your provider. Do not stop using the antibiotic even if you start to feel better. Abscess care  If you have an abscess that has not drained, apply heat to the affected area. Use the heat source that your provider recommends, such as a moist heat pack or a heating pad. Place a towel between your skin and the heat source. Leave the heat on for 20-30 minutes at a time. If your skin turns bright red, remove the heat right away to prevent burns. The risk of burns is higher if you cannot feel pain, heat, or cold. Follow instructions from your provider about how to take care of your abscess. Make sure you: Cover the abscess with a bandage (dressing). Wash your hands with soap and water for at least 20 seconds before  and after you change the dressing or gauze. If soap and water are not available, use hand sanitizer. Change your dressing or gauze as told by your provider. Check your abscess every day for signs of an infection that is getting worse. Check for: More redness, swelling, pain, or tenderness. More fluid or blood. Warmth. More pus or a worse smell. General instructions To avoid spreading the infection: Do not share personal care items, towels, or hot tubs with others. Avoid making skin contact with other people. Be careful  when getting rid of used dressings, wound packing, or any drainage from the abscess. Do not use any products that contain nicotine or tobacco. These products include cigarettes, chewing tobacco, and vaping devices, such as e-cigarettes. If you need help quitting, ask your provider. Do not use any creams, ointments, or liquids unless you have been told to by your provider. Contact a health care provider if: You see redness that spreads quickly or red streaks on your skin spreading away from the abscess. You have any signs of worse infection at the abscess. You vomit every time you eat or drink. You have a fever, chills, or muscle aches. The cyst or abscess returns. Get help right away if: You have severe pain. You make less pee (urine) than normal. This information is not intended to replace advice given to you by your health care provider. Make sure you discuss any questions you have with your health care provider. Document Revised: 01/10/2022 Document Reviewed: 01/10/2022 [employer] Patient Education  2024 [employer].

## 2024-06-08 ENCOUNTER — Telehealth

## 2024-06-08 DIAGNOSIS — L0291 Cutaneous abscess, unspecified: Secondary | ICD-10-CM

## 2024-06-08 NOTE — Progress Notes (Signed)
 Pt was treated for abscess under rt breast 05/10/24 with bactrim . It decreased in size however it has returned with more pus and pain. She is advised to go to [facility] for I & D and culture. She agrees. DWB
